# Patient Record
Sex: Female | Born: 1980 | Race: White | Hispanic: No | Marital: Married | Smoking: Current every day smoker
Health system: Southern US, Community
[De-identification: ages and names within clinical notes are randomized; demographics above are authoritative.]

## PROBLEM LIST (undated history)

## (undated) DIAGNOSIS — G43909 Migraine, unspecified, not intractable, without status migrainosus: Secondary | ICD-10-CM

## (undated) DIAGNOSIS — Z87898 Personal history of other specified conditions: Secondary | ICD-10-CM

## (undated) DIAGNOSIS — Z973 Presence of spectacles and contact lenses: Secondary | ICD-10-CM

## (undated) DIAGNOSIS — I10 Essential (primary) hypertension: Secondary | ICD-10-CM

## (undated) DIAGNOSIS — Z8669 Personal history of other diseases of the nervous system and sense organs: Secondary | ICD-10-CM

## (undated) HISTORY — PX: TONSILLECTOMY: SUR1361

## (undated) HISTORY — PX: CARDIOVASCULAR STRESS TEST: SHX262

---

## 2001-11-05 ENCOUNTER — Other Ambulatory Visit: Admission: RE | Admit: 2001-11-05 | Discharge: 2001-11-05 | Payer: Self-pay | Admitting: Obstetrics and Gynecology

## 2001-12-23 ENCOUNTER — Encounter: Payer: Self-pay | Admitting: Obstetrics and Gynecology

## 2001-12-23 ENCOUNTER — Ambulatory Visit (HOSPITAL_COMMUNITY): Admission: RE | Admit: 2001-12-23 | Discharge: 2001-12-23 | Payer: Self-pay | Admitting: Obstetrics and Gynecology

## 2002-01-08 ENCOUNTER — Encounter: Payer: Self-pay | Admitting: Obstetrics and Gynecology

## 2002-01-08 ENCOUNTER — Ambulatory Visit (HOSPITAL_COMMUNITY): Admission: RE | Admit: 2002-01-08 | Discharge: 2002-01-08 | Payer: Self-pay | Admitting: Obstetrics and Gynecology

## 2002-03-21 ENCOUNTER — Encounter: Payer: Self-pay | Admitting: Obstetrics and Gynecology

## 2002-03-21 ENCOUNTER — Inpatient Hospital Stay (HOSPITAL_COMMUNITY): Admission: AD | Admit: 2002-03-21 | Discharge: 2002-03-28 | Payer: Self-pay | Admitting: Gynecology

## 2002-03-23 ENCOUNTER — Encounter: Payer: Self-pay | Admitting: Obstetrics and Gynecology

## 2002-03-29 ENCOUNTER — Encounter: Admission: RE | Admit: 2002-03-29 | Discharge: 2002-04-28 | Payer: Self-pay | Admitting: Obstetrics and Gynecology

## 2002-04-29 ENCOUNTER — Encounter: Admission: RE | Admit: 2002-04-29 | Discharge: 2002-05-29 | Payer: Self-pay | Admitting: Obstetrics and Gynecology

## 2010-09-25 HISTORY — PX: TUMOR REMOVAL: SHX12

## 2011-11-21 ENCOUNTER — Emergency Department (HOSPITAL_COMMUNITY)
Admission: EM | Admit: 2011-11-21 | Discharge: 2011-11-21 | Disposition: A | Discharge status: Home Health | Payer: BC Managed Care – PPO | Attending: Emergency Medicine | Admitting: Emergency Medicine

## 2011-11-21 ENCOUNTER — Encounter (HOSPITAL_COMMUNITY): Payer: Self-pay | Admitting: *Deleted

## 2011-11-21 ENCOUNTER — Emergency Department (HOSPITAL_COMMUNITY): Payer: BC Managed Care – PPO

## 2011-11-21 DIAGNOSIS — F17200 Nicotine dependence, unspecified, uncomplicated: Secondary | ICD-10-CM | POA: Insufficient documentation

## 2011-11-21 DIAGNOSIS — R51 Headache: Secondary | ICD-10-CM | POA: Insufficient documentation

## 2011-11-21 HISTORY — DX: Migraine, unspecified, not intractable, without status migrainosus: G43.909

## 2011-11-21 MED ORDER — MAGNESIUM SULFATE 40 MG/ML IJ SOLN
2.0000 g | Freq: Once | INTRAMUSCULAR | Status: AC
  Administered 2011-11-21: 2 g via INTRAVENOUS
  Filled 2011-11-21: qty 50

## 2011-11-21 MED ORDER — METOCLOPRAMIDE HCL 5 MG/ML IJ SOLN
10.0000 mg | Freq: Once | INTRAMUSCULAR | Status: AC
  Administered 2011-11-21: 10 mg via INTRAVENOUS
  Filled 2011-11-21: qty 2

## 2011-11-21 MED ORDER — DIPHENHYDRAMINE HCL 50 MG/ML IJ SOLN
25.0000 mg | Freq: Once | INTRAMUSCULAR | Status: AC
  Administered 2011-11-21: 25 mg via INTRAVENOUS
  Filled 2011-11-21: qty 1

## 2011-11-21 NOTE — ED Notes (Signed)
Coke and cup of ice provided to pt per her request.  IV Magnesium infusion completed.

## 2011-11-21 NOTE — ED Notes (Signed)
To CDU 1 via stretcher.

## 2011-11-21 NOTE — ED Notes (Signed)
Report was given to [employer]

## 2011-11-21 NOTE — ED Notes (Addendum)
Assumed patient's care. [name], skin warm and dry, respiration even and unlabored. Seen and examined by [name]

## 2011-11-21 NOTE — ED Provider Notes (Signed)
History     CSN: [id number]  Arrival date & time 11/21/11  4098   First MD Initiated Contact with Patient 11/21/11 [phone]      Chief Complaint  Patient presents with  . Headache    (Consider location/radiation/quality/duration/timing/severity/associated sxs/prior treatment) HPI Comments: Patient with a history of migraines presents with onset of headache that is different than her previous. Patient has a several year history of right-sided migraines that are resolved with Imitrex. Patient however now has left neck and left lateral head pain that feels different. She has tried Imitrex and Relpax which have not helped. She was seen at [facility] emergency department twice and was last treated with Zofran, Toradol, and steroids it did not help. She was prescribed Vicodin and then Percocet by her primary care physician that did not help. She was given soma which did not help as well. Patient has photophobia and phonophobia. She has nausea but no vomiting. The pain is constant but waxes and wanes. She denies trouble walking, blurry vision. Patient has an upcoming neurology appointment in April.  Patient is a 31 y.o. female presenting with migraine. The history is provided by the patient.  Migraine This is a new problem. The current episode started 1 to 4 weeks ago. The problem occurs constantly. The problem has been waxing and waning. Associated symptoms include headaches, nausea and neck pain. Pertinent negatives include no abdominal pain, anorexia, chest pain, congestion, coughing, fever, myalgias, numbness, rash, sore throat, visual change, vomiting or weakness. Exacerbated by: light and sound. She has tried nothing for the symptoms.    Past Medical History  Diagnosis Date  . Migraines     Past Surgical History  Procedure Date  . Tumor removal     R leg  . Cesarean section   . Tonsillectomy     Family History  Problem Relation Age of Onset  . Diabetes Mother   . Heart failure  Father   . Cancer Other   . Heart failure Other   . Diabetes Other     History  Substance Use Topics  . Smoking status: Current Everyday Smoker -- 1.0 packs/day  . Smokeless tobacco: Not on file  . Alcohol Use: Yes     occaisionally    OB History    Grav Para Term Preterm Abortions TAB SAB Ect Mult Living                  Review of Systems  Constitutional: Negative for fever.  HENT: Positive for neck pain. Negative for congestion, sore throat, rhinorrhea and neck stiffness.   Eyes: Negative for redness.  Respiratory: Negative for cough.   Cardiovascular: Negative for chest pain.  Gastrointestinal: Positive for nausea. Negative for vomiting, abdominal pain, diarrhea and anorexia.  Genitourinary: Negative for dysuria.  Musculoskeletal: Negative for myalgias.  Skin: Negative for rash.  Neurological: Positive for headaches. Negative for seizures, syncope, speech difficulty, weakness and numbness.  Psychiatric/Behavioral: Negative for confusion.    Allergies  Review of patient's allergies indicates no known allergies.  Home Medications   Current Outpatient Rx  Name Route Sig Dispense Refill  . ETONOGESTREL-ETHINYL ESTRADIOL 0.12-0.015 MG/24HR VA RING Vaginal Place 1 each vaginally every 28 (twenty-eight) days. Insert vaginally and leave in place for 3 consecutive weeks, then remove for 1 week.    Marland Kitchen PROMETHAZINE HCL 25 MG PO TABS Oral Take 25 mg by mouth every 6 (six) hours as needed.      BP 157/95  Pulse 132  Temp(Src)  98.5 F (36.9 C) (Oral)  Resp 20  SpO2 100%  LMP 10/30/2011  Physical Exam  Nursing note and vitals reviewed. Constitutional: She is oriented to person, place, and time. She appears well-developed and well-nourished.  HENT:  Head: Normocephalic and atraumatic.  Eyes: Conjunctivae are normal. Right eye exhibits no discharge. Left eye exhibits no discharge.  Neck: Normal range of motion. Neck supple.       No meningismus  Cardiovascular: Normal  rate, regular rhythm and normal heart sounds.   Pulmonary/Chest: Effort normal and breath sounds normal.  Abdominal: Soft. There is no tenderness.  Neurological: She is alert and oriented to person, place, and time. She has normal strength. No cranial nerve deficit or sensory deficit. She displays a negative Romberg sign. GCS eye subscore is 4. GCS verbal subscore is 5. GCS motor subscore is 6.  Skin: Skin is warm and dry.  Psychiatric: She has a normal mood and affect.    ED Course  Procedures (including critical care time)  Labs Reviewed - No data to display No results found.   No diagnosis found.  7:26 AM Patient seen and examined. Work-up initiated. Medications ordered.   Vital signs reviewed and are as follows: Filed Vitals:   11/21/11 0624  BP: 157/95  Pulse: 132  Temp: 98.5 F (36.9 C)  Resp: 20   Will CT head given 2 previous ED visits, 2 PCP visits, change in headache pattern, not responding to traditional treatment.   7:40 AM Patient was discussed with Ward Givens, MD  8:59 AM CT reviewed and is normal. Patient informed of results. Migraine cocktail did not provide any relief. Will move to CDU and give 2 g of magnesium. If this does not work we may try Valproate 1 g over an hour.  Patient discussed with Albertine Grates.    MDM  Intractable HA, different than previous migraines. CT performed and is unconcerning. No concern for meningitis, pseudotumor given exam and history. No h/o head injury. Will give treatments in CDU. Patient to follow-up with her neurologist.         Carolee Rota, PA 11/21/11 1001

## 2011-11-21 NOTE — Discharge Instructions (Signed)
Please followup with your doctor at 11:30 as scheduled he now is return to the emergency department for worsening symptoms.   Headaches, Frequently Asked Questions MIGRAINE HEADACHES Q: What is migraine? What causes it? How can I treat it? A: Generally, migraine headaches begin as a dull ache. Then they develop into a constant, throbbing, and pulsating pain. You may experience pain at the temples. You may experience pain at the front or back of one or both sides of the head. The pain is usually accompanied by a combination of:  Nausea.   Vomiting.   Sensitivity to light and noise.  Some people (about 15%) experience an aura (see below) before an attack. The cause of migraine is believed to be chemical reactions in the brain. Treatment for migraine may include over-the-counter or prescription medications. It may also include self-help techniques. These include relaxation training and biofeedback.  Q: What is an aura? A: About 15% of people with migraine get an "aura". This is a sign of neurological symptoms that occur before a migraine headache. You may see wavy or jagged lines, dots, or flashing lights. You might experience tunnel vision or blind spots in one or both eyes. The aura can include visual or auditory hallucinations (something imagined). It may include disruptions in smell (such as strange odors), taste or touch. Other symptoms include:  Numbness.   A "pins and needles" sensation.   Difficulty in recalling or speaking the correct word.  These neurological events may last as long as 60 minutes. These symptoms will fade as the headache begins. Q: What is a trigger? A: Certain physical or environmental factors can lead to or "trigger" a migraine. These include:  Foods.   Hormonal changes.   Weather.   Stress.  It is important to remember that triggers are different for everyone. To help prevent migraine attacks, you need to figure out which triggers affect you. Keep a headache  diary. This is a good way to track triggers. The diary will help you talk to your healthcare professional about your condition. Q: Does weather affect migraines? A: Bright sunshine, hot, humid conditions, and drastic changes in barometric pressure may lead to, or "trigger," a migraine attack in some people. But studies have shown that weather does not act as a trigger for everyone with migraines. Q: What is the link between migraine and hormones? A: Hormones start and regulate many of your body's functions. Hormones keep your body in balance within a constantly changing environment. The levels of hormones in your body are unbalanced at times. Examples are during menstruation, pregnancy, or menopause. That can lead to a migraine attack. In fact, about three quarters of all women with migraine report that their attacks are related to the menstrual cycle.  Q: Is there an increased risk of stroke for migraine sufferers? A: The likelihood of a migraine attack causing a stroke is very remote. That is not to say that migraine sufferers cannot have a stroke associated with their migraines. In persons under age 74, the most common associated factor for stroke is migraine headache. But over the course of a person's normal life span, the occurrence of migraine headache may actually be associated with a reduced risk of dying from cerebrovascular disease due to stroke.  Q: What are acute medications for migraine? A: Acute medications are used to treat the pain of the headache after it has started. Examples over-the-counter medications, NSAIDs, ergots, and triptans.  Q: What are the triptans? A: Triptans are the newest  class of abortive medications. They are specifically targeted to treat migraine. Triptans are vasoconstrictors. They moderate some chemical reactions in the brain. The triptans work on receptors in your brain. Triptans help to restore the balance of a neurotransmitter called serotonin. Fluctuations in  levels of serotonin are thought to be a main cause of migraine.  Q: Are over-the-counter medications for migraine effective? A: Over-the-counter, or "OTC," medications may be effective in relieving mild to moderate pain and associated symptoms of migraine. But you should see your caregiver before beginning any treatment regimen for migraine.  Q: What are preventive medications for migraine? A: Preventive medications for migraine are sometimes referred to as "prophylactic" treatments. They are used to reduce the frequency, severity, and length of migraine attacks. Examples of preventive medications include antiepileptic medications, antidepressants, beta-blockers, calcium channel blockers, and NSAIDs (nonsteroidal anti-inflammatory drugs). Q: Why are anticonvulsants used to treat migraine? A: During the past few years, there has been an increased interest in antiepileptic drugs for the prevention of migraine. They are sometimes referred to as "anticonvulsants". Both epilepsy and migraine may be caused by similar reactions in the brain.  Q: Why are antidepressants used to treat migraine? A: Antidepressants are typically used to treat people with depression. They may reduce migraine frequency by regulating chemical levels, such as serotonin, in the brain.  Q: What alternative therapies are used to treat migraine? A: The term "alternative therapies" is often used to describe treatments considered outside the scope of conventional Western medicine. Examples of alternative therapy include acupuncture, acupressure, and yoga. Another common alternative treatment is herbal therapy. Some herbs are believed to relieve headache pain. Always discuss alternative therapies with your caregiver before proceeding. Some herbal products contain arsenic and other toxins. TENSION HEADACHES Q: What is a tension-type headache? What causes it? How can I treat it? A: Tension-type headaches occur randomly. They are often the  result of temporary stress, anxiety, fatigue, or anger. Symptoms include soreness in your temples, a tightening band-like sensation around your head (a "vice-like" ache). Symptoms can also include a pulling feeling, pressure sensations, and contracting head and neck muscles. The headache begins in your forehead, temples, or the back of your head and neck. Treatment for tension-type headache may include over-the-counter or prescription medications. Treatment may also include self-help techniques such as relaxation training and biofeedback. CLUSTER HEADACHES Q: What is a cluster headache? What causes it? How can I treat it? A: Cluster headache gets its name because the attacks come in groups. The pain arrives with little, if any, warning. It is usually on one side of the head. A tearing or bloodshot eye and a runny nose on the same side of the headache may also accompany the pain. Cluster headaches are believed to be caused by chemical reactions in the brain. They have been described as the most severe and intense of any headache type. Treatment for cluster headache includes prescription medication and oxygen. SINUS HEADACHES Q: What is a sinus headache? What causes it? How can I treat it? A: When a cavity in the bones of the face and skull (a sinus) becomes inflamed, the inflammation will cause localized pain. This condition is usually the result of an allergic reaction, a tumor, or an infection. If your headache is caused by a sinus blockage, such as an infection, you will probably have a fever. An x-ray will confirm a sinus blockage. Your caregiver's treatment might include antibiotics for the infection, as well as antihistamines or decongestants.  REBOUND HEADACHES Q:  What is a rebound headache? What causes it? How can I treat it? A: A pattern of taking acute headache medications too often can lead to a condition known as "rebound headache." A pattern of taking too much headache medication includes taking  it more than 2 days per week or in excessive amounts. That means more than the label or a caregiver advises. With rebound headaches, your medications not only stop relieving pain, they actually begin to cause headaches. Doctors treat rebound headache by tapering the medication that is being overused. Sometimes your caregiver will gradually substitute a different type of treatment or medication. Stopping may be a challenge. Regularly overusing a medication increases the potential for serious side effects. Consult a caregiver if you regularly use headache medications more than 2 days per week or more than the label advises. ADDITIONAL QUESTIONS AND ANSWERS Q: What is biofeedback? A: Biofeedback is a self-help treatment. Biofeedback uses special equipment to monitor your body's involuntary physical responses. Biofeedback monitors:  Breathing.   Pulse.   Heart rate.   Temperature.   Muscle tension.   Brain activity.  Biofeedback helps you refine and perfect your relaxation exercises. You learn to control the physical responses that are related to stress. Once the technique has been mastered, you do not need the equipment any more. Q: Are headaches hereditary? A: Four out of five (80%) of people that suffer report a family history of migraine. Scientists are not sure if this is genetic or a family predisposition. Despite the uncertainty, a child has a 50% chance of having migraine if one parent suffers. The child has a 75% chance if both parents suffer.  Q: Can children get headaches? A: By the time they reach high school, most young people have experienced some type of headache. Many safe and effective approaches or medications can prevent a headache from occurring or stop it after it has begun.  Q: What type of doctor should I see to diagnose and treat my headache? A: Start with your primary caregiver. Discuss his or her experience and approach to headaches. Discuss methods of classification,  diagnosis, and treatment. Your caregiver may decide to recommend you to a headache specialist, depending upon your symptoms or other physical conditions. Having diabetes, allergies, etc., may require a more comprehensive and inclusive approach to your headache. The National Headache Foundation will provide, upon request, a list of [facility] physician members in your state. Document Released: 12/02/2003 Document Revised: 05/24/2011 Document Reviewed: 05/11/2008 [facility] Patient Information 2012 [place], [place].

## 2011-11-21 NOTE — ED Provider Notes (Signed)
Medical screening examination/treatment/procedure(s) were performed by non-physician practitioner and as supervising physician I was immediately available for consultation/collaboration. Devoria Albe, MD, FACEP   Ward Givens, MD 11/21/11 [phone]

## 2011-11-21 NOTE — ED Notes (Signed)
Bridgette, PA at pt bedside at this time.  Pt states her neurologist can see her in his office today at 1130.

## 2011-11-21 NOTE — ED Notes (Signed)
J. Geiple PAC at bedside. 

## 2011-11-21 NOTE — ED Notes (Addendum)
dx'd with migraines, c/o HA, onset 2/5, ongoing since then, was in ED in [facility] Sunday & yesterday, also seen multiple visits at [facility]. No relief with multiple meds & txs given.  Also reports nausea. Wearing sunglasses. Pinpoints pain to "behind L eye and behind L ear", "different from usual migraines".

## 2011-11-21 NOTE — ED Notes (Signed)
Patient here for migraine headache onset feb 5th, has taken her home meds taht are prescribed and no relief, pt sts that hx of same.

## 2011-11-21 NOTE — ED Notes (Signed)
Ambulated to restroom. With PA resident.

## 2011-11-21 NOTE — ED Notes (Signed)
Pt claimed that her headache is not better. Renne Crigler [facility] made aware.

## 2011-11-21 NOTE — ED Provider Notes (Signed)
10:13 AM Patient was placed in CDU for headache control. Currently receiving magnesium IV for headache. Patient states she has spoken with her neurologist, Dr Anne Hahn. States he is able to see her today at 11:30 and she requests to be discharged. No acute findings on CT of the head. Likely progression of chronic migraines.  Thomasene Lot, PA-C 11/21/11 1016

## 2011-11-21 NOTE — ED Provider Notes (Signed)
Medical screening examination/treatment/procedure(s) were performed by non-physician practitioner and as supervising physician I was immediately available for consultation/collaboration. Devoria Albe, MD, Armando Gang   Ward Givens, MD 11/21/11 [phone]

## 2013-06-03 IMAGING — CT CT HEAD W/O CM
1 of 2 series · 13 of 30 positions shown, 17 images · non-contrast
Comparison: None.

CLINICAL DATA: Headache, history of migraines, pain behind the left
eye

CT HEAD WITHOUT CONTRAST
TECHNIQUE: Contiguous axial images were obtained from the base of
the skull through the vertex without contrast.

[Series 2: brain · axial · 0.47mm/px · z∈[+159,+280]mm · 13 of 28 slices shown, 17 images]
[im 2/28  brain]
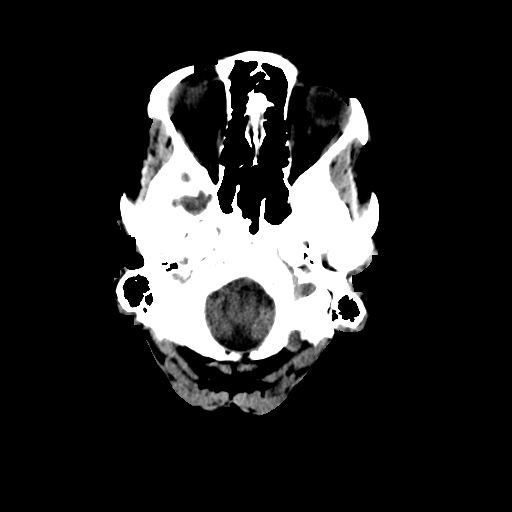
[im 2/28  bone]
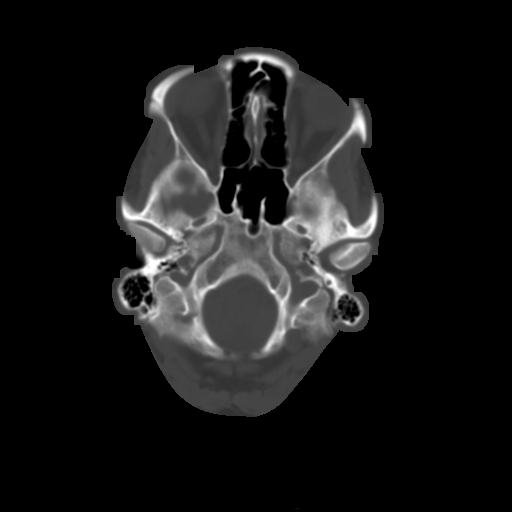
[im 4/28  brain]
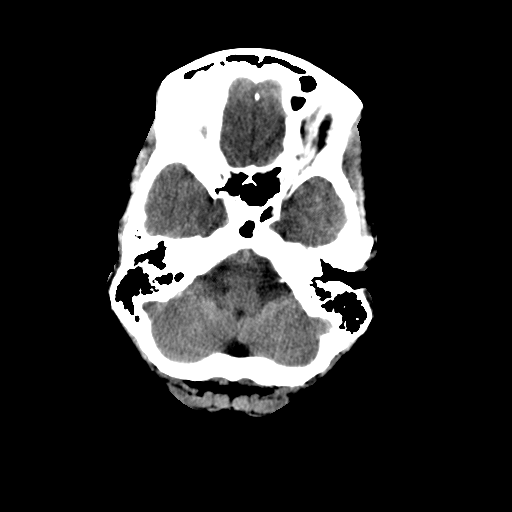
[im 6/28  brain]
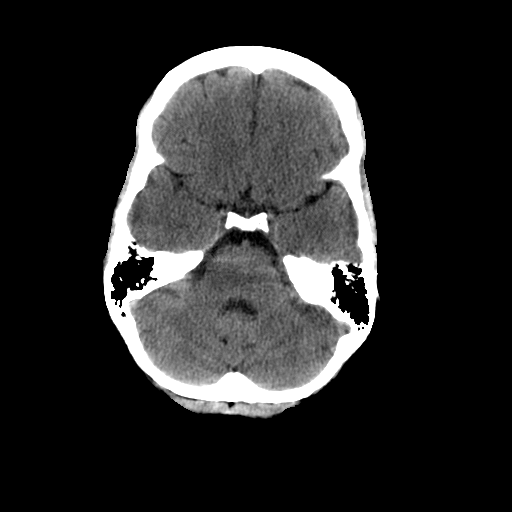
[im 8/28  brain]
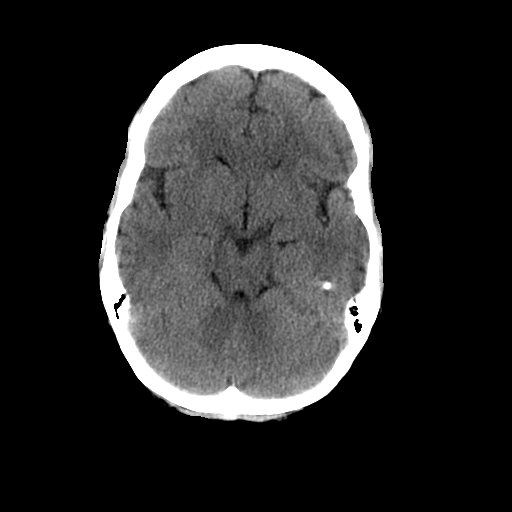
[im 10/28  brain]
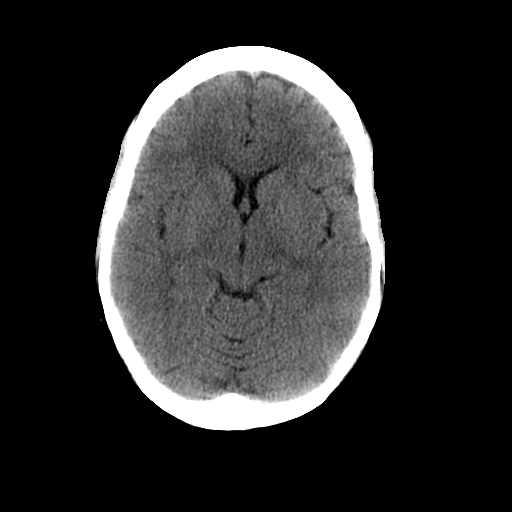
[im 10/28  bone]
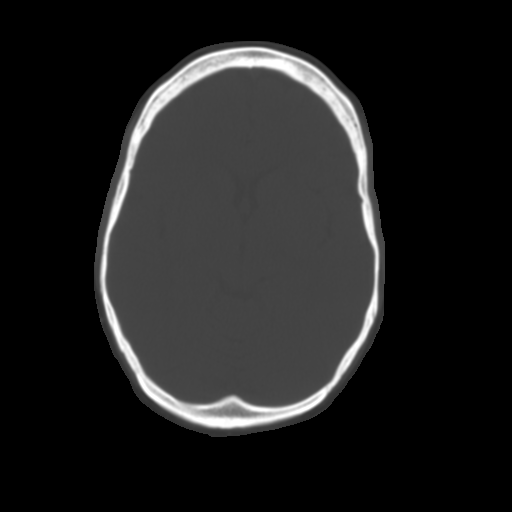
[im 12/28  brain]
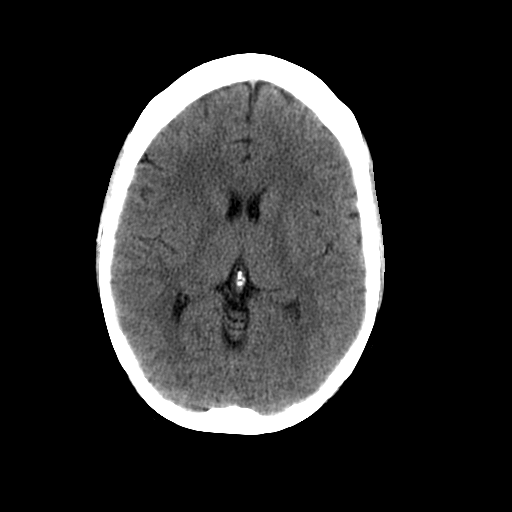
[im 14/28  brain]
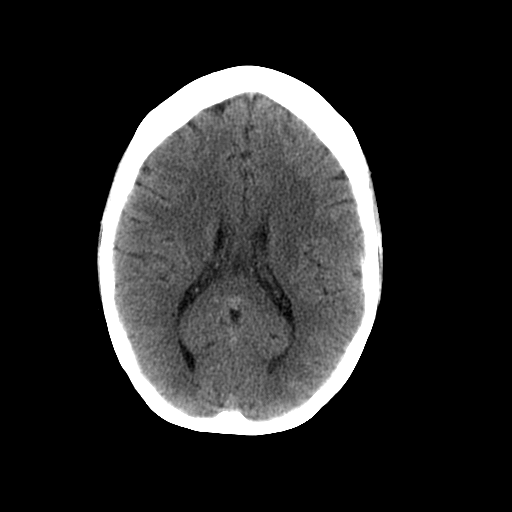
[im 16/28  brain]
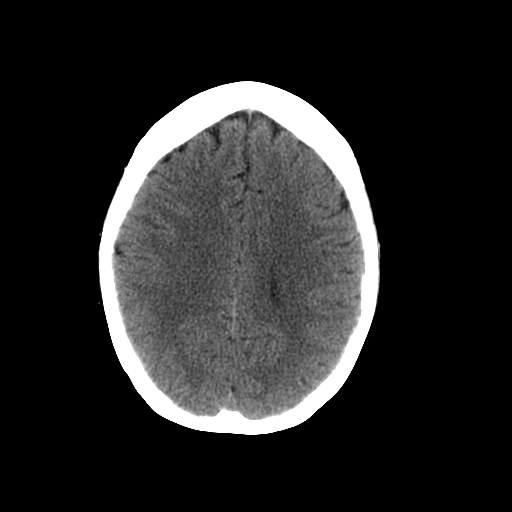
[im 18/28  brain]
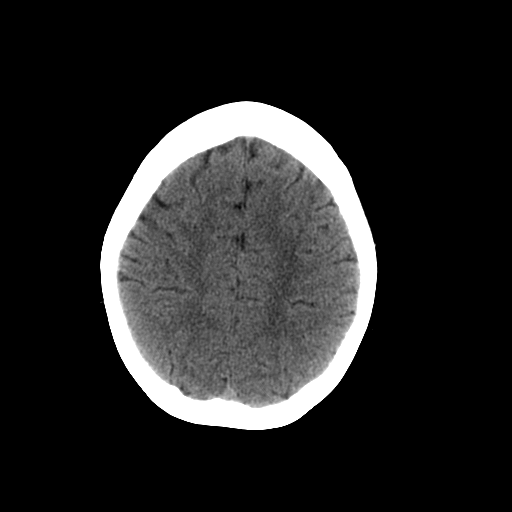
[im 18/28  bone]
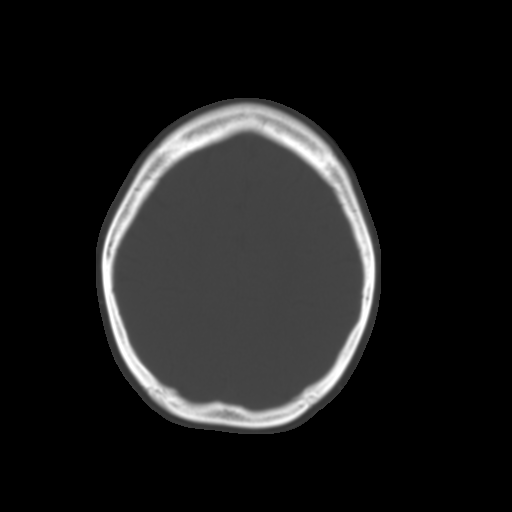
[im 20/28  brain]
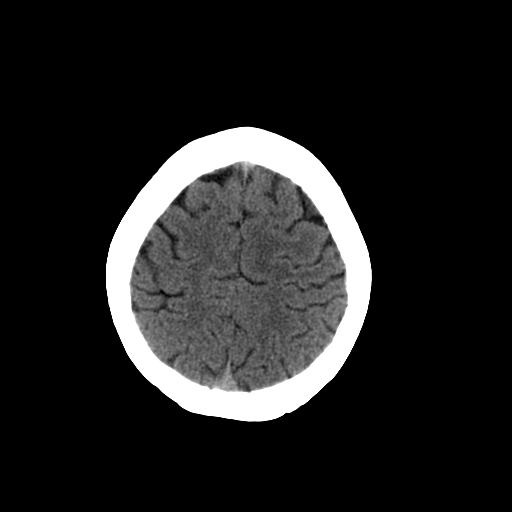
[im 22/28  brain]
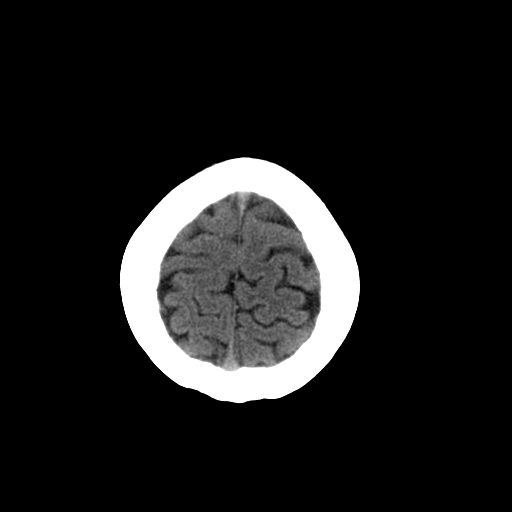
[im 24/28  brain]
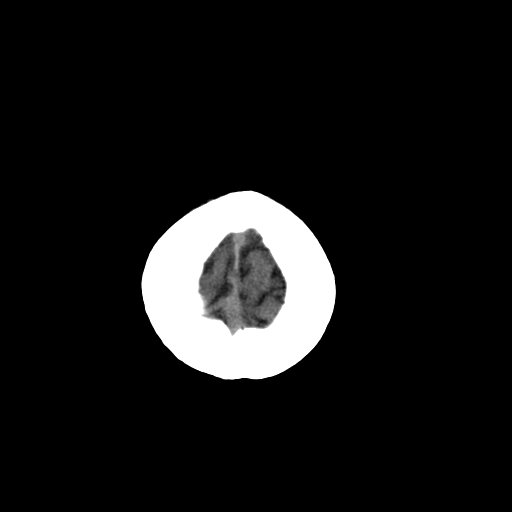
[im 26/28  brain]
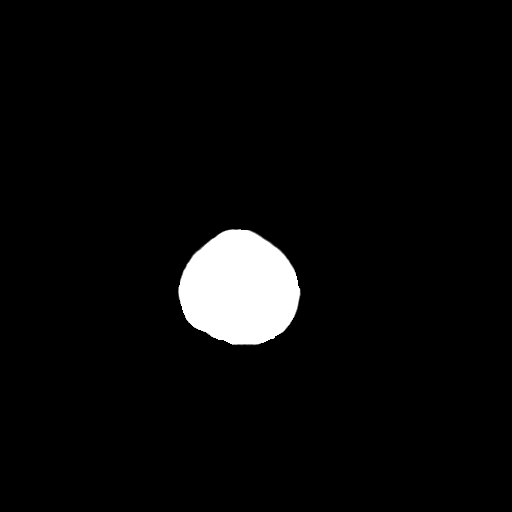
[im 26/28  bone]
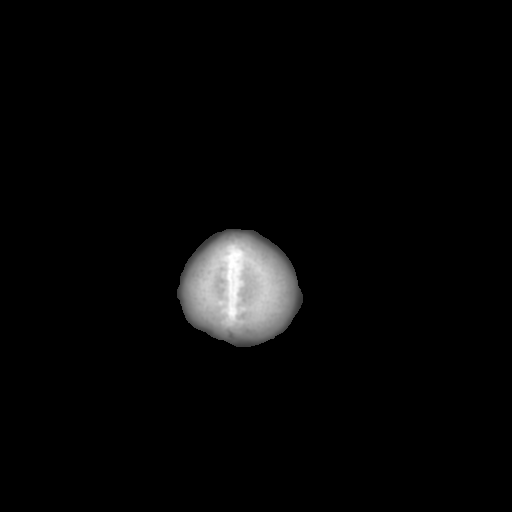

[13 of 30 positions shown; findings below may reference images not displayed]

FINDINGS: The ventricular system is normal in size and
configuration and the septum is midline in position.  The fourth
ventricle and basilar cisterns appear normal.  No hemorrhage, mass
lesion, or acute infarction is seen.  On bone window images, no
calvarial abnormality is seen.  The paranasal sinuses are clear.
IMPRESSION: Negative unenhanced CT of the brain.

## 2015-05-21 ENCOUNTER — Other Ambulatory Visit (HOSPITAL_COMMUNITY): Payer: Self-pay | Admitting: Obstetrics and Gynecology

## 2015-05-21 DIAGNOSIS — R1012 Left upper quadrant pain: Secondary | ICD-10-CM

## 2015-05-26 ENCOUNTER — Ambulatory Visit (HOSPITAL_COMMUNITY)
Admission: RE | Admit: 2015-05-26 | Discharge: 2015-05-26 | Disposition: A | Discharge status: Home / Self Care | Payer: PRIVATE HEALTH INSURANCE | Source: Ambulatory Visit | Attending: Obstetrics and Gynecology | Admitting: Obstetrics and Gynecology

## 2015-05-26 DIAGNOSIS — R1012 Left upper quadrant pain: Secondary | ICD-10-CM | POA: Diagnosis not present

## 2015-05-26 DIAGNOSIS — F17200 Nicotine dependence, unspecified, uncomplicated: Secondary | ICD-10-CM | POA: Diagnosis not present

## 2017-08-12 IMAGING — US US ABDOMEN COMPLETE
1 series · 14 of 25 positions shown · non-contrast
Comparison: None.

CLINICAL DATA: LEFT upper quadrant abdominal pain for 6 months
intermittently, smoker

EXAM:
ULTRASOUND ABDOMEN COMPLETE

[Series 1: us abdomen complete · 0.20mm/px · 14 of 33 slices shown]
[im 1/33]
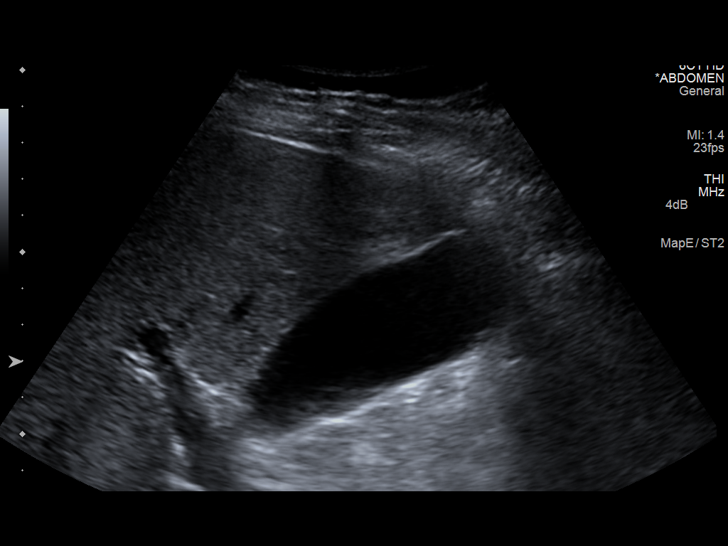
[im 3/33]
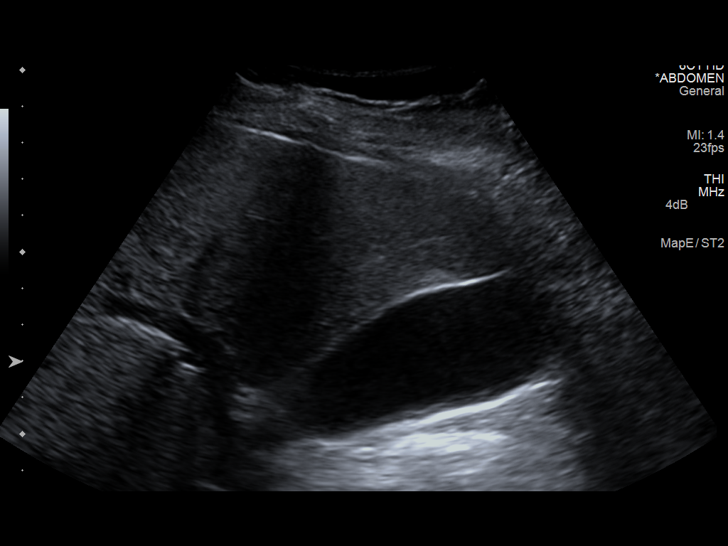
[im 6/33]
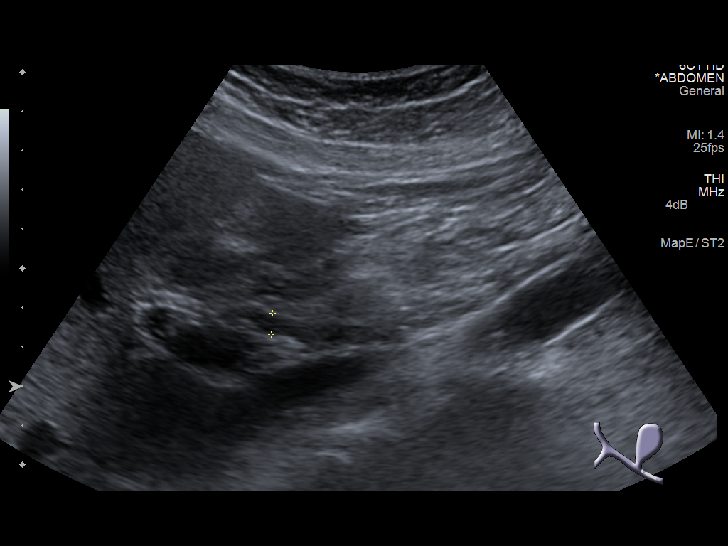
[im 9/33]
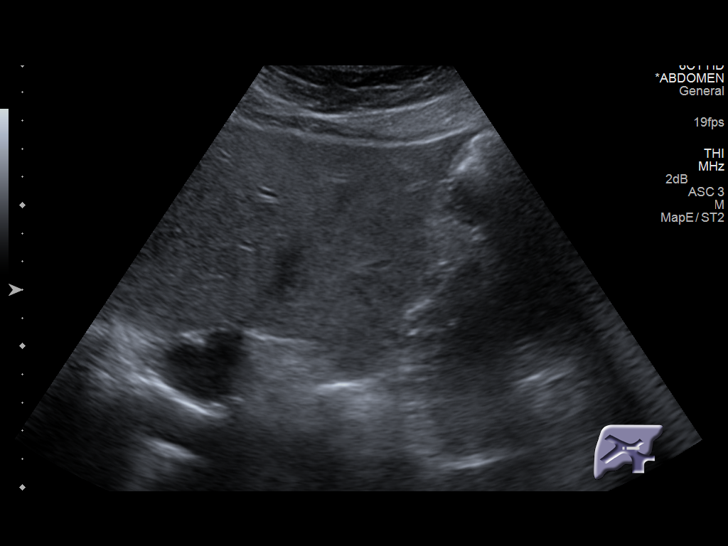
[im 11/33]
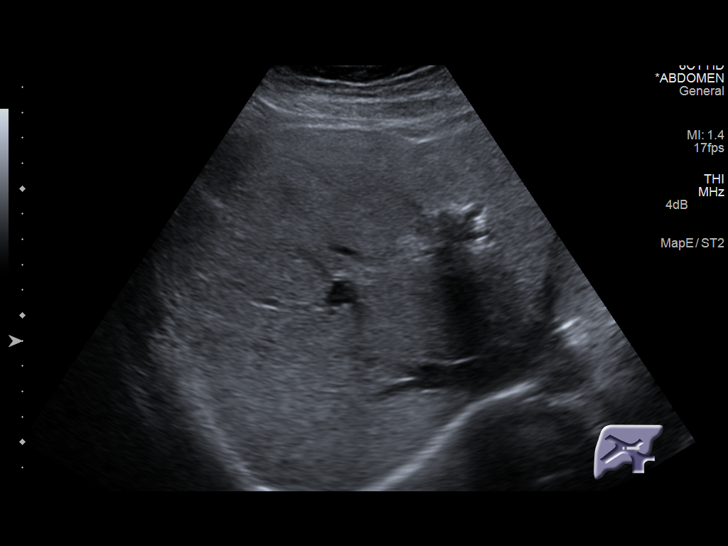
[im 13/33]
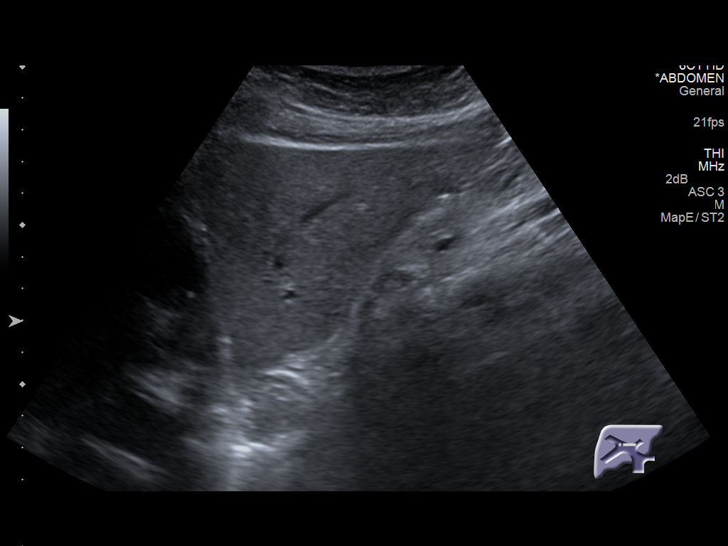
[im 15/33]
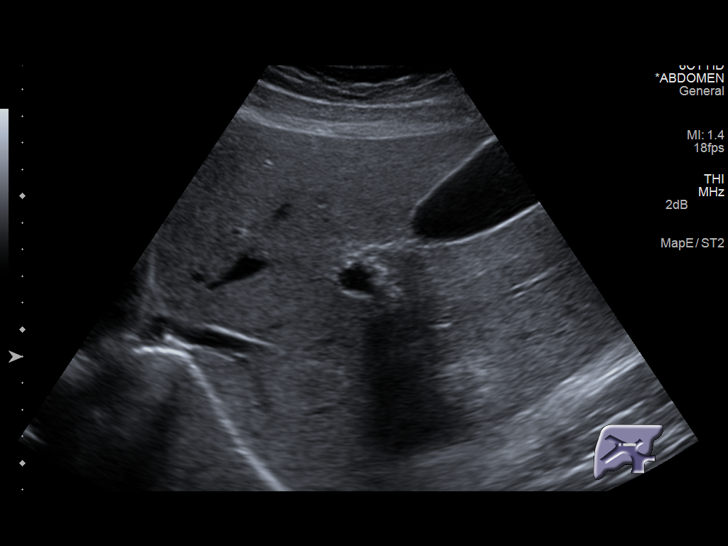
[im 18/33]
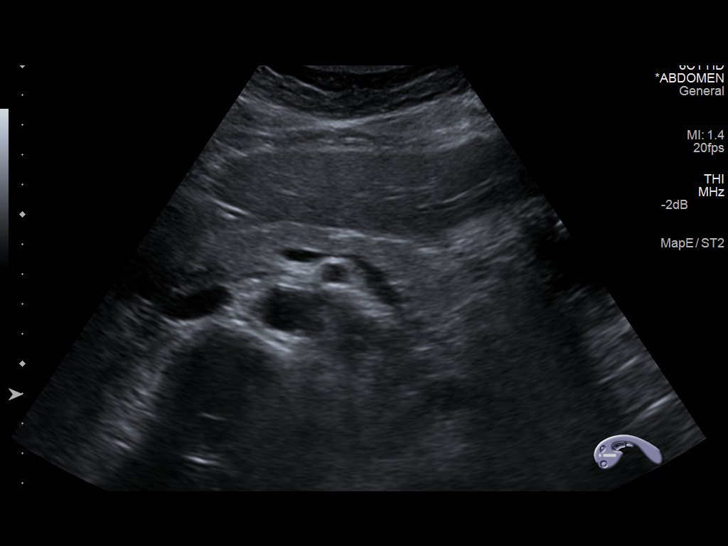
[im 21/33]
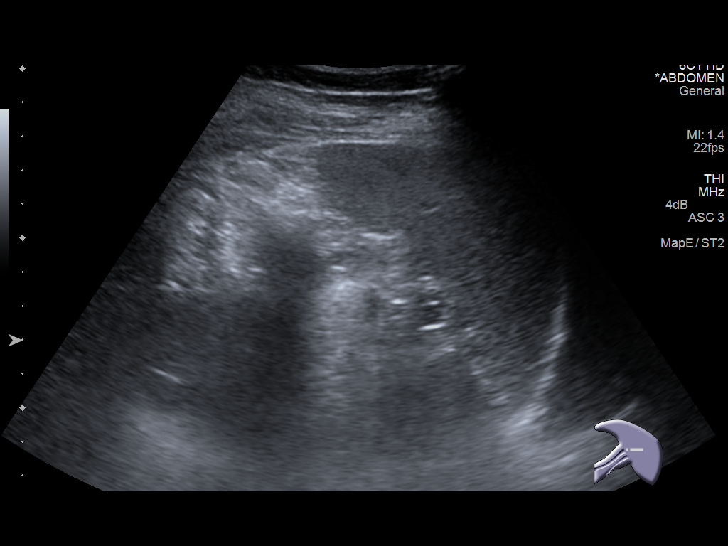
[im 22/33]
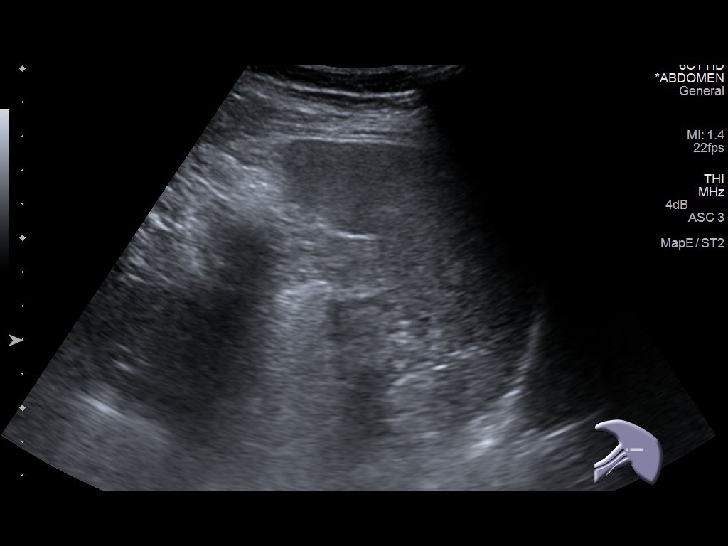
[im 25/33]
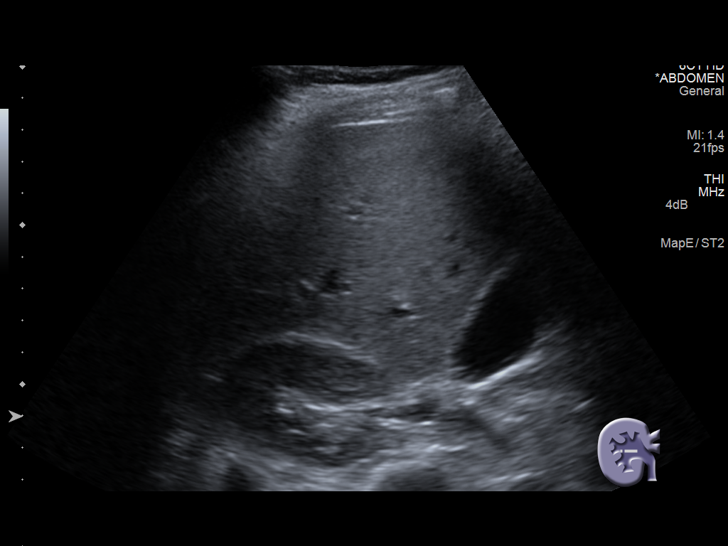
[im 27/33]
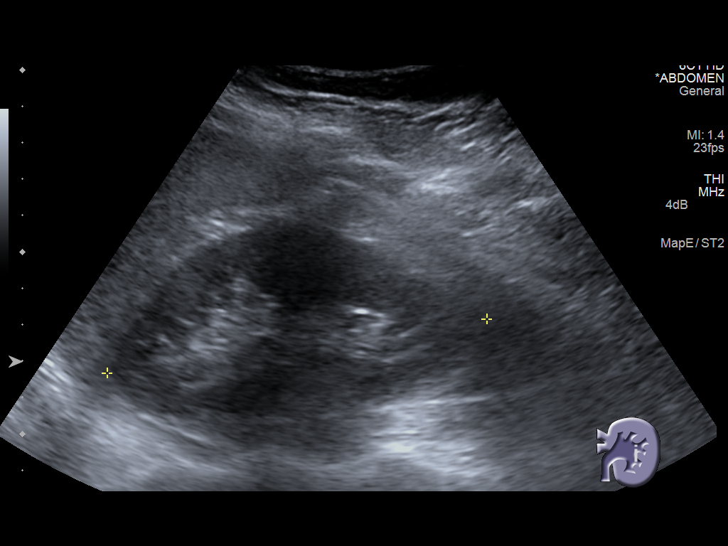
[im 30/33]
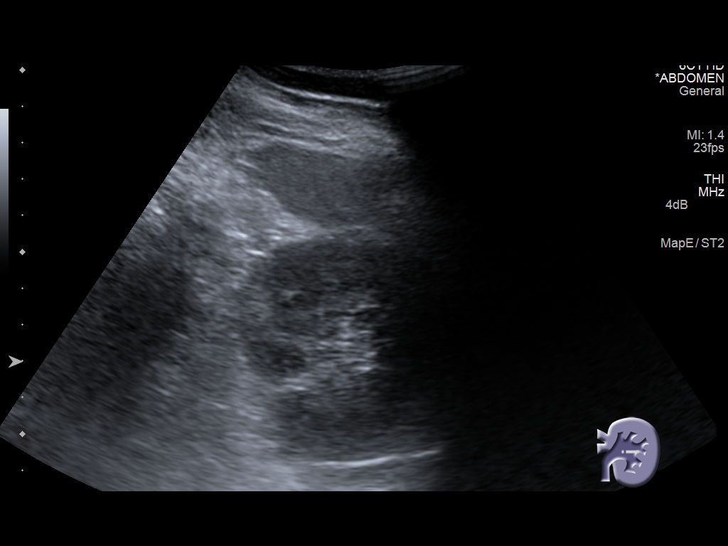
[im 33/33]
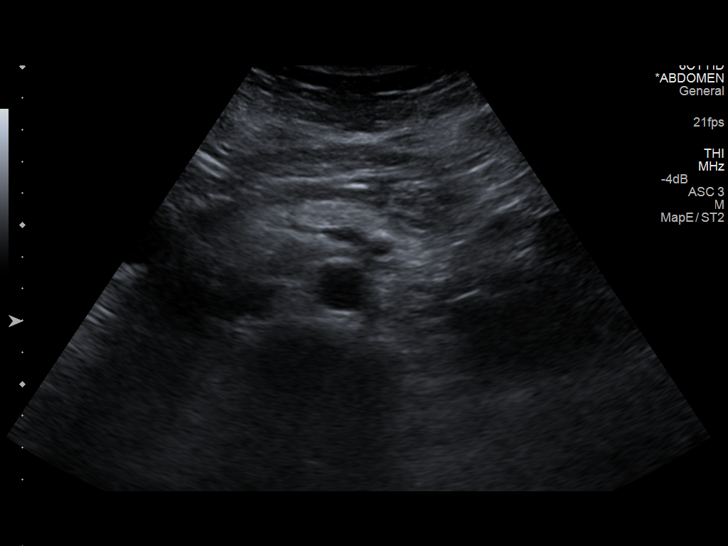

[14 of 25 positions shown; findings below may reference images not displayed]

FINDINGS: Gallbladder: Normally distended without stones or wall thickening.
No pericholecystic fluid or sonographic Murphy sign.

Common bile duct: Diameter: 5 mm diameter , normal

Liver: Echogenic, likely fatty infiltration, though this can be seen
with cirrhosis and certain infiltrative disorders. No focal hepatic
mass or nodularity. Hepatopetal portal venous flow.

IVC: Normal appearance

Pancreas: Normal appearance

Spleen: Normal appearance, 5.6 cm length

Right Kidney: Length: 9.8 cm. Normal morphology without mass or
hydronephrosis.

Left Kidney: Length: 10.6 cm. Normal morphology without mass or
hydronephrosis.

Abdominal aorta: Normal caliber

Other findings: No free fluid
IMPRESSION: Probable mild fatty infiltration liver as above.

Otherwise normal exam.

## 2018-05-08 ENCOUNTER — Encounter (HOSPITAL_BASED_OUTPATIENT_CLINIC_OR_DEPARTMENT_OTHER): Payer: Self-pay | Admitting: *Deleted

## 2018-05-10 ENCOUNTER — Encounter (HOSPITAL_BASED_OUTPATIENT_CLINIC_OR_DEPARTMENT_OTHER): Payer: Self-pay | Admitting: *Deleted

## 2018-05-10 NOTE — Progress Notes (Addendum)
Spoke w/ pt via phone for pre-op interview.  Npo after mn.  Arrive at [id number].  Needs urine preg.  Getting cbc, bmet, and ekg done Tuesday 05-14-2018  @ 1300.

## 2018-05-14 ENCOUNTER — Encounter (HOSPITAL_COMMUNITY)
Admission: RE | Admit: 2018-05-14 | Discharge: 2018-05-14 | Disposition: A | Discharge status: Home / Self Care | Payer: 59 | Source: Ambulatory Visit | Attending: Obstetrics and Gynecology | Admitting: Obstetrics and Gynecology

## 2018-05-14 ENCOUNTER — Other Ambulatory Visit: Payer: Self-pay

## 2018-05-14 DIAGNOSIS — F17210 Nicotine dependence, cigarettes, uncomplicated: Secondary | ICD-10-CM | POA: Diagnosis not present

## 2018-05-14 DIAGNOSIS — Z302 Encounter for sterilization: Secondary | ICD-10-CM | POA: Diagnosis present

## 2018-05-14 DIAGNOSIS — I10 Essential (primary) hypertension: Secondary | ICD-10-CM | POA: Diagnosis not present

## 2018-05-14 LAB — CBC
HCT: 41.3 % (ref 36.0–46.0)
Hemoglobin: 14.4 g/dL (ref 12.0–15.0)
MCH: 34.6 pg — AB (ref 26.0–34.0)
MCHC: 34.9 g/dL (ref 30.0–36.0)
MCV: 99.3 fL (ref 78.0–100.0)
PLATELETS: 160 10*3/uL (ref 150–400)
RBC: 4.16 MIL/uL (ref 3.87–5.11)
RDW: 12.2 % (ref 11.5–15.5)
WBC: 6.3 10*3/uL (ref 4.0–10.5)

## 2018-05-14 LAB — BASIC METABOLIC PANEL
Anion gap: 10 (ref 5–15)
BUN: 7 mg/dL (ref 6–20)
CALCIUM: 9.3 mg/dL (ref 8.9–10.3)
CO2: 24 mmol/L (ref 22–32)
CREATININE: 0.76 mg/dL (ref 0.44–1.00)
Chloride: 107 mmol/L (ref 98–111)
GFR calc Af Amer: >60 mL/min (ref >60)
GLUCOSE: 164 mg/dL — AB (ref 70–99)
Potassium: 3.8 mmol/L (ref 3.5–5.1)
Sodium: 141 mmol/L (ref 135–145)

## 2018-05-16 NOTE — H&P (Signed)
Brittney [name] is an 37 y.o. female. She desires permanent sterility so is being admitted for laparoscopic BTL.  Pertinent Gynecological History: Last pap: normal Date: 01/2017 OB History: G1, P0101   Menstrual History: Patient's last menstrual period was 05/02/2018 (exact date).    Past Medical History:  Diagnosis Date  . History of migraine   . History of palpitations   . Hypertension    05-10-2018  per pt has been dx w/ htn but has taken any medication since 2015,  per pt last bp taken was 140/ 90  . Migraines   . Wears glasses     Past Surgical History:  Procedure Laterality Date  . CARDIOVASCULAR STRESS TEST  12-19-2013   @[facility]   normal nuclear perfusion w/ no ischemia/  normal LV function and wall motion ,  ef 68%  . CESAREAN SECTION  2003  . TONSILLECTOMY  age 89  . TUMOR REMOVAL  2012   Right  leg (benign)    Family History  Problem Relation Age of Onset  . Heart failure Father   . Diabetes Mother   . Cancer Other   . Heart failure Other   . Diabetes Other     Social History:  reports that she has been smoking cigarettes. She has a 22.00 pack-year smoking history. She has never used smokeless tobacco. She reports that she drinks about 14.0 standard drinks of alcohol per week. She reports that she does not use drugs.  Allergies: No Known Allergies  No medications prior to admission.    Review of Systems  Respiratory: Negative.   Cardiovascular: Negative.     Height 5\' 7"  (1.702 m), weight 65.8 kg, last menstrual period 05/02/2018. Physical Exam  Constitutional: She appears well-developed and well-nourished.  Neck: Neck supple. No thyromegaly present.  Cardiovascular: Normal rate, regular rhythm and normal heart sounds.  No murmur heard. Respiratory: Effort normal and breath sounds normal. No respiratory distress. She has no wheezes.  GI: Soft. She exhibits no distension and no mass. There is no tenderness.  Genitourinary:  Vagina normal and uterus normal.    No results found for this or any previous visit (from the past 24 hour(s)).  No results found.  Assessment/Plan: Desires permanent sterility.  Discussed all options for contraception, surgical procedure, risks, failure rate and permanency.  Will admit for laparoscopic bilateral salpingectomy.  Leighton Roachodd D Delphine Sizemore 05/16/2018, 1:04 PM

## 2018-05-17 ENCOUNTER — Ambulatory Visit (HOSPITAL_BASED_OUTPATIENT_CLINIC_OR_DEPARTMENT_OTHER)
Admission: RE | Admit: 2018-05-17 | Discharge: 2018-05-17 | Disposition: A | Discharge status: Home / Self Care | Payer: 59 | Source: Ambulatory Visit | Attending: Obstetrics and Gynecology | Admitting: Obstetrics and Gynecology

## 2018-05-17 ENCOUNTER — Encounter (HOSPITAL_BASED_OUTPATIENT_CLINIC_OR_DEPARTMENT_OTHER): Payer: Self-pay | Admitting: *Deleted

## 2018-05-17 ENCOUNTER — Encounter (HOSPITAL_BASED_OUTPATIENT_CLINIC_OR_DEPARTMENT_OTHER)
Admission: RE | Disposition: A | Discharge status: Home / Self Care | Payer: Self-pay | Source: Ambulatory Visit | Attending: Obstetrics and Gynecology

## 2018-05-17 ENCOUNTER — Ambulatory Visit (HOSPITAL_BASED_OUTPATIENT_CLINIC_OR_DEPARTMENT_OTHER): Payer: 59 | Admitting: Anesthesiology

## 2018-05-17 ENCOUNTER — Other Ambulatory Visit: Payer: Self-pay

## 2018-05-17 DIAGNOSIS — Z302 Encounter for sterilization: Secondary | ICD-10-CM

## 2018-05-17 DIAGNOSIS — F17210 Nicotine dependence, cigarettes, uncomplicated: Secondary | ICD-10-CM | POA: Insufficient documentation

## 2018-05-17 DIAGNOSIS — I10 Essential (primary) hypertension: Secondary | ICD-10-CM | POA: Insufficient documentation

## 2018-05-17 HISTORY — PX: LAPAROSCOPIC BILATERAL SALPINGECTOMY: SHX5889

## 2018-05-17 HISTORY — DX: Presence of spectacles and contact lenses: Z97.3

## 2018-05-17 HISTORY — DX: Personal history of other diseases of the nervous system and sense organs: Z86.69

## 2018-05-17 HISTORY — DX: Essential (primary) hypertension: I10

## 2018-05-17 HISTORY — DX: Personal history of other specified conditions: Z87.898

## 2018-05-17 LAB — POCT PREGNANCY, URINE: Preg Test, Ur: NEGATIVE

## 2018-05-17 SURGERY — SALPINGECTOMY, BILATERAL, LAPAROSCOPIC
Anesthesia: General | Laterality: Bilateral

## 2018-05-17 MED ORDER — 0.9 % SODIUM CHLORIDE (POUR BTL) OPTIME
TOPICAL | Status: DC | PRN
  Administered 2018-05-17: 1000 mL

## 2018-05-17 MED ORDER — FENTANYL CITRATE (PF) 100 MCG/2ML IJ SOLN
INTRAMUSCULAR | Status: AC
  Filled 2018-05-17: qty 2

## 2018-05-17 MED ORDER — MIDAZOLAM HCL 2 MG/2ML IJ SOLN
INTRAMUSCULAR | Status: AC
  Filled 2018-05-17: qty 2

## 2018-05-17 MED ORDER — PROPOFOL 10 MG/ML IV BOLUS
INTRAVENOUS | Status: AC
  Filled 2018-05-17: qty 20

## 2018-05-17 MED ORDER — SCOPOLAMINE 1 MG/3DAYS TD PT72
MEDICATED_PATCH | TRANSDERMAL | Status: AC
  Filled 2018-05-17: qty 1

## 2018-05-17 MED ORDER — PROPOFOL 10 MG/ML IV BOLUS
INTRAVENOUS | Status: DC | PRN
  Administered 2018-05-17: 150 mg via INTRAVENOUS

## 2018-05-17 MED ORDER — CEFAZOLIN SODIUM-DEXTROSE 2-4 GM/100ML-% IV SOLN
INTRAVENOUS | Status: AC
  Filled 2018-05-17: qty 100

## 2018-05-17 MED ORDER — KETOROLAC TROMETHAMINE 30 MG/ML IJ SOLN
INTRAMUSCULAR | Status: DC | PRN
  Administered 2018-05-17: 30 mg via INTRAVENOUS

## 2018-05-17 MED ORDER — LACTATED RINGERS IV SOLN
INTRAVENOUS | Status: DC
  Filled 2018-05-17: qty 1000

## 2018-05-17 MED ORDER — BUPIVACAINE HCL (PF) 0.25 % IJ SOLN
INTRAMUSCULAR | Status: DC | PRN
  Administered 2018-05-17: 10 mL

## 2018-05-17 MED ORDER — SUGAMMADEX SODIUM 200 MG/2ML IV SOLN
INTRAVENOUS | Status: AC
  Filled 2018-05-17: qty 2

## 2018-05-17 MED ORDER — OXYCODONE HCL 5 MG PO TABS
ORAL_TABLET | ORAL | Status: AC
  Filled 2018-05-17: qty 1

## 2018-05-17 MED ORDER — ESMOLOL HCL 100 MG/10ML IV SOLN
INTRAVENOUS | Status: DC | PRN
  Administered 2018-05-17: 20 mg via INTRAVENOUS

## 2018-05-17 MED ORDER — MIDAZOLAM HCL 5 MG/5ML IJ SOLN
INTRAMUSCULAR | Status: DC | PRN
  Administered 2018-05-17: 2 mg via INTRAVENOUS

## 2018-05-17 MED ORDER — HYDROCODONE-ACETAMINOPHEN 5-325 MG PO TABS: 1.0000 | ORAL_TABLET | Freq: Four times a day (QID) | ORAL | 0 refills | Status: AC | PRN

## 2018-05-17 MED ORDER — DEXAMETHASONE SODIUM PHOSPHATE 10 MG/ML IJ SOLN
INTRAMUSCULAR | Status: DC | PRN
  Administered 2018-05-17: 10 mg via INTRAVENOUS

## 2018-05-17 MED ORDER — ONDANSETRON HCL 4 MG/2ML IJ SOLN
INTRAMUSCULAR | Status: AC
  Filled 2018-05-17: qty 2

## 2018-05-17 MED ORDER — SCOPOLAMINE 1 MG/3DAYS TD PT72
1.0000 | MEDICATED_PATCH | Freq: Once | TRANSDERMAL | Status: DC
  Administered 2018-05-17: 1.5 mg via TRANSDERMAL
  Filled 2018-05-17: qty 1

## 2018-05-17 MED ORDER — ACETAMINOPHEN 500 MG PO TABS
ORAL_TABLET | ORAL | Status: AC
  Filled 2018-05-17: qty 2

## 2018-05-17 MED ORDER — DEXAMETHASONE SODIUM PHOSPHATE 10 MG/ML IJ SOLN
INTRAMUSCULAR | Status: AC
  Filled 2018-05-17: qty 1

## 2018-05-17 MED ORDER — FENTANYL CITRATE (PF) 100 MCG/2ML IJ SOLN
INTRAMUSCULAR | Status: DC | PRN
  Administered 2018-05-17 (×2): 50 ug via INTRAVENOUS

## 2018-05-17 MED ORDER — OXYCODONE HCL 5 MG PO TABS
5.0000 mg | ORAL_TABLET | Freq: Once | ORAL | Status: AC
  Administered 2018-05-17: 5 mg via ORAL
  Filled 2018-05-17: qty 1

## 2018-05-17 MED ORDER — LACTATED RINGERS IV SOLN
INTRAVENOUS | Status: DC
  Administered 2018-05-17 (×2): via INTRAVENOUS
  Filled 2018-05-17: qty 1000

## 2018-05-17 MED ORDER — ROCURONIUM BROMIDE 100 MG/10ML IV SOLN
INTRAVENOUS | Status: DC | PRN
  Administered 2018-05-17: 40 mg via INTRAVENOUS

## 2018-05-17 MED ORDER — KETOROLAC TROMETHAMINE 30 MG/ML IJ SOLN
INTRAMUSCULAR | Status: AC
  Filled 2018-05-17: qty 1

## 2018-05-17 MED ORDER — LIDOCAINE 2% (20 MG/ML) 5 ML SYRINGE
INTRAMUSCULAR | Status: AC
  Filled 2018-05-17: qty 5

## 2018-05-17 MED ORDER — SUGAMMADEX SODIUM 200 MG/2ML IV SOLN
INTRAVENOUS | Status: DC | PRN
  Administered 2018-05-17: 150 mg via INTRAVENOUS

## 2018-05-17 MED ORDER — BUPIVACAINE HCL (PF) 0.25 % IJ SOLN
INTRAMUSCULAR | Status: AC
  Filled 2018-05-17: qty 30

## 2018-05-17 MED ORDER — PROMETHAZINE HCL 25 MG/ML IJ SOLN
6.2500 mg | INTRAMUSCULAR | Status: DC | PRN
  Filled 2018-05-17: qty 1

## 2018-05-17 MED ORDER — FENTANYL CITRATE (PF) 100 MCG/2ML IJ SOLN
25.0000 ug | INTRAMUSCULAR | Status: DC | PRN
  Administered 2018-05-17 (×2): 50 ug via INTRAVENOUS
  Filled 2018-05-17: qty 1

## 2018-05-17 MED ORDER — ONDANSETRON HCL 4 MG/2ML IJ SOLN
INTRAMUSCULAR | Status: DC | PRN
  Administered 2018-05-17: 4 mg via INTRAVENOUS

## 2018-05-17 MED ORDER — LIDOCAINE 2% (20 MG/ML) 5 ML SYRINGE
INTRAMUSCULAR | Status: DC | PRN
  Administered 2018-05-17: 80 mg via INTRAVENOUS

## 2018-05-17 MED ORDER — CEFAZOLIN SODIUM-DEXTROSE 2-4 GM/100ML-% IV SOLN
2.0000 g | Freq: Once | INTRAVENOUS | Status: AC
  Administered 2018-05-17: 2 g via INTRAVENOUS
  Filled 2018-05-17: qty 100

## 2018-05-17 MED ORDER — ACETAMINOPHEN 500 MG PO TABS
1000.0000 mg | ORAL_TABLET | Freq: Once | ORAL | Status: AC
  Administered 2018-05-17: 1000 mg via ORAL
  Filled 2018-05-17: qty 2

## 2018-05-17 SURGICAL SUPPLY — 25 items
ADH SKN CLS APL DERMABOND .7 (GAUZE/BANDAGES/DRESSINGS) ×1
CATH ROBINSON RED A/P 16FR (CATHETERS) ×3 IMPLANT
COVER MAYO STAND STRL (DRAPES) ×3 IMPLANT
DERMABOND ADVANCED (GAUZE/BANDAGES/DRESSINGS) ×2
DERMABOND ADVANCED .7 DNX12 (GAUZE/BANDAGES/DRESSINGS) ×1 IMPLANT
DRSG COVADERM PLUS 2X2 (GAUZE/BANDAGES/DRESSINGS) IMPLANT
DURAPREP 26ML APPLICATOR (WOUND CARE) ×3 IMPLANT
GLOVE BIO SURGEON STRL SZ8 (GLOVE) ×3 IMPLANT
GLOVE ORTHO TXT STRL SZ7.5 (GLOVE) ×3 IMPLANT
GOWN STRL REUS W/TWL XL LVL3 (GOWN DISPOSABLE) ×3 IMPLANT
NEEDLE INSUFFLATION 120MM (ENDOMECHANICALS) ×3 IMPLANT
NS IRRIG 500ML POUR BTL (IV SOLUTION) ×3 IMPLANT
PACK LAPAROSCOPY BASIN (CUSTOM PROCEDURE TRAY) ×2 IMPLANT
PACK TRENDGUARD 450 HYBRID PRO (MISCELLANEOUS) IMPLANT
PAD OB MATERNITY 4.3X12.25 (PERSONAL CARE ITEMS) ×3 IMPLANT
SUT VIC AB 3-0 CTX 36 (SUTURE) IMPLANT
SUT VIC AB 3-0 PS2 18 (SUTURE) ×3
SUT VIC AB 3-0 PS2 18XBRD (SUTURE) ×1 IMPLANT
SUT VICRYL 0 UR6 27IN ABS (SUTURE) ×3 IMPLANT
TOWEL OR 17X24 6PK STRL BLUE (TOWEL DISPOSABLE) ×6 IMPLANT
TRENDGUARD 450 HYBRID PRO PACK (MISCELLANEOUS) ×3
TROCAR XCEL NON-BLD 11X100MML (ENDOMECHANICALS) ×3 IMPLANT
TROCAR XCEL NON-BLD 5MMX100MML (ENDOMECHANICALS) ×3 IMPLANT
TUBING INSUF HEATED (TUBING) ×3 IMPLANT
WARMER LAPAROSCOPE (MISCELLANEOUS) ×3 IMPLANT

## 2018-05-17 NOTE — Discharge Instructions (Signed)
Routine instructions for laparoscopy ° °Laparoscopy- Care After °Refer to this sheet in the next few weeks. These instructions provide you with information about caring for yourself after your procedure. Your health care provider may also give you more specific instructions. Your treatment has been planned according to current medical practices, but problems sometimes occur. Call your health care provider if you have any problems or questions after your procedure. °What can I expect after the procedure? °After the procedure, it is common to have: °· A sore throat. °· Discomfort in your shoulder. °· Mild discomfort or cramping in your abdomen. °· Gas pains. °· Pain or soreness in the area where the surgical cut (incision) was made. °· A bloated feeling. °· Tiredness. °· Nausea. °· Vomiting. ° °Follow these instructions at home: °Medicines °· Take over-the-counter and prescription medicines only as told by your health care provider. °· Do not take aspirin because it can cause bleeding. °· Do not drive or operate heavy machinery while taking prescription pain medicine. °Activity °· Rest for the rest of the day. °· Return to your normal activities as told by your health care provider. Ask your health care provider what activities are safe for you. °Incision care ° °· Follow instructions from your health care provider about how to take care of your incision. Make sure you: °? Wash your hands with soap and water before you change your bandage (dressing). If soap and water are not available, use hand sanitizer. °? Change your dressing as told by your health care provider. °? Leave stitches (sutures) in place. They may need to stay in place for 2 weeks or longer. °· Check your incision area every day for signs of infection. Check for: °? More redness, swelling, or pain. °? More fluid or blood. °? Warmth. °? Pus or a bad smell. °Other Instructions °· Do not take baths, swim, or use a hot tub until your health care provider  approves. You may take showers. °· Keep all follow-up visits as told by your health care provider. This is important. °· Have someone help you with your daily household tasks for the first few days. °Contact a health care provider if: °· You have more redness, swelling, or pain around your incision. °· Your incision feels warm to the touch. °· You have pus or a bad smell coming from your incision. °· The edges of your incision break open after the sutures have been removed. °· Your pain does not improve after 2-3 days. °· You have a rash. °· You repeatedly become dizzy or light-headed. °· Your pain medicine is not helping. °· You are constipated. °Get help right away if: °· You have a fever. °· You faint. °· You have increasing pain in your abdomen. °· You have severe pain in one or both of your shoulders. °· You have fluid or blood coming from your sutures or from your vagina. °· You have shortness of breath or difficulty breathing. °· You have chest pain or leg pain. °· You have ongoing nausea, vomiting, or diarrhea. °This information is not intended to replace advice given to you by your health care provider. Make sure you discuss any questions you have with your health care provider. °Document Released: 03/31/2005 Document Revised: 02/14/2016 Document Reviewed: 08/22/2015 °Elsevier Interactive Patient Education © 2018 [employer] ° °Post Anesthesia Home Care Instructions ° °Activity: °Get plenty of rest for the remainder of the day. A responsible individual must stay with you for 24 hours following the procedure.  °  For the next 24 hours, DO NOT: -Drive a car -Paediatric nurse -Drink alcoholic beverages -Take any medication unless instructed by your physician -Make any legal decisions or sign important papers.  Meals: Start with liquid foods such as gelatin or soup. Progress to regular foods as tolerated. Avoid greasy, spicy, heavy foods. If nausea and/or vomiting occur, drink only clear liquids until  the nausea and/or vomiting subsides. Call your physician if vomiting continues.  Special Instructions/Symptoms: Your throat may feel dry or sore from the anesthesia or the breathing tube placed in your throat during surgery. If this causes discomfort, gargle with warm salt water. The discomfort should disappear within 24 hours.  If you had a scopolamine patch placed behind your ear for the management of post- operative nausea and/or vomiting:  1. The medication in the patch is effective for 72 hours, after which it should be removed.  Wrap patch in a tissue and discard in the trash. Wash hands thoroughly with soap and water. 2. You may remove the patch earlier than 72 hours if you experience unpleasant side effects which may include dry mouth, dizziness or visual disturbances. 3. Avoid touching the patch. Wash your hands with soap and water after contact with the patch.

## 2018-05-17 NOTE — Op Note (Signed)
Preoperative diagnosis: Desires surgical sterility Postoperative diagnosis: Same Procedure: Laparoscopic bilateral salpingectomy Surgeon: Lavina Hammanodd Quintina Hakeem M.D. Anesthesia: Gen. Endotracheal tube Findings: She had a normal abdomen and pelvis with normal uterus tubes and ovaries, omentum and anterior uterus adherent to lower abdominal wall Specimens: Bilateral fallopian tubes Estimated blood loss: Minimal Complications: None  Procedure in detail  The patient was taken to the operating room and placed in the dorsosupine position. General anesthesia was induced. Her legs were placed in mobile stirrups and her left arm was tucked to her side. Abdomen perineum and vagina were then prepped and draped in the usual sterile fashion, bladder drained with a red robinson catheter, an acorn tenaculum was applied to the cervix for uterine manipulation. Infraumbilical skin was then infiltrated with quarter percent Marcaine and a 1 cm vertical incision was made. The veress needle was inserted into the peritoneal cavity and placement confirmed by the water drop test and an opening pressure of 8 mm of mercury. CO2 was insufflated to a pressure of 14 mm of mercury and the veress needle was removed. A 10/11 disposable trocar was then introduced with direct visualization with the laparoscope. A 5 mm port was then placed low in the midline also under direct visualization. Inspection revealed the above-mentioned findings.  Omental adhesions were taken down with bipolar cautery and scissors.  She is not having significant pain so the uterine adhesions were left intact.  The distal end of each tube was grasped and elevated. Using bipolar cautery I was able to free the distal end of each tube from the ovary and fulgurate across the mesosalpinx and a proximal portion of the fallopian tube. Scissors were then used to remove the fallopian tube. This is done bilaterally without difficulty. Both segments of tube were removed through the  umbilical trocar. The 5 mm port was removed under direct visualization. All gas was allowed to deflate from the abdomen and the umbilical trocar was removed. One figure-of-eight suture of 0 Vicryl was placed in the umbilical incision. Skin incisions were then closed with interrupted subcuticular sutures of 4-0 Vicryl followed by Dermabond. The acorn tenaculum was removed. The patient was taken down from stirrups. She was awakened in the operating room and taken to the recovery room in stable condition after tolerating the procedure well. Counts were correct and she had PAS hose on throughout the procedure.

## 2018-05-17 NOTE — Interval H&P Note (Signed)
History and Physical Interval Note:  05/17/2018 9:40 AM  Brittney [name]  has presented today for surgery, with the diagnosis of Sterilization  The various methods of treatment have been discussed with the patient and family. After consideration of risks, benefits and other options for treatment, the patient has consented to  Procedure(s): LAPAROSCOPIC TUBAL LIGATION (Bilateral) as a surgical intervention .  The patient's history has been reviewed, patient examined, no change in status, stable for surgery.  I have reviewed the patient's chart and labs.  Questions were answered to the patient's satisfaction.     Leighton Roachodd D Erick Oxendine

## 2018-05-17 NOTE — Anesthesia Procedure Notes (Signed)
Procedure Name: Intubation Date/Time: 05/17/2018 10:06 AM Performed by: Marny Lowensteinapozzi, Nyheim Seufert W, CRNA Pre-anesthesia Checklist: Patient identified, Emergency Drugs available, Suction available and Patient being monitored Patient Re-evaluated:Patient Re-evaluated prior to induction Oxygen Delivery Method: Circle system utilized Preoxygenation: Pre-oxygenation with 100% oxygen Induction Type: IV induction Ventilation: Mask ventilation without difficulty Laryngoscope Size: Miller and 2 Grade View: Grade I Tube type: Oral Number of attempts: 1 Placement Confirmation: ETT inserted through vocal cords under direct vision,  positive ETCO2,  CO2 detector and breath sounds checked- equal and bilateral Secured at: 21 cm Tube secured with: Tape Dental Injury: Teeth and Oropharynx as per pre-operative assessment

## 2018-05-17 NOTE — Anesthesia Postprocedure Evaluation (Signed)
Anesthesia Post Note  Patient: Brittney [name]  Procedure(s) Performed: LAPAROSCOPIC BILATERAL SALPINGECTOMY (Bilateral )     Patient location during evaluation: PACU Anesthesia Type: General Level of consciousness: awake and alert, oriented and awake Pain management: pain level controlled Vital Signs Assessment: post-procedure vital signs reviewed and stable Respiratory status: spontaneous breathing, nonlabored ventilation and respiratory function stable Cardiovascular status: blood pressure returned to baseline and stable Postop Assessment: no apparent nausea or vomiting Anesthetic complications: no    Last Vitals:  Vitals:   05/17/18 1145 05/17/18 1230  BP: (!) 153/99 (!) 150/83  Pulse: 62 65  Resp: 13 14  Temp:  36.7 C  SpO2: 100% 100%    Last Pain:  Vitals:   05/17/18 1200  TempSrc:   PainSc: 4                  Cecile HearingStephen Edward Therman Hughlett

## 2018-05-17 NOTE — Anesthesia Preprocedure Evaluation (Addendum)
Anesthesia Evaluation  Patient identified by MRN, date of birth, ID band Patient awake    Reviewed: Allergy & Precautions, NPO status , Patient's Chart, lab work & pertinent test results  Airway Mallampati: II  TM Distance: >3 FB Neck ROM: Full    Dental  (+) Teeth Intact, Dental Advisory Given   Pulmonary Current Smoker (+Smoked on DOS),    Pulmonary exam normal breath sounds clear to auscultation       Cardiovascular hypertension, Normal cardiovascular exam Rhythm:Regular Rate:Normal     Neuro/Psych  Headaches, negative psych ROS   GI/Hepatic negative GI ROS, (+)     substance abuse  alcohol use,   Endo/Other  negative endocrine ROS  Renal/GU negative Renal ROS     Musculoskeletal negative musculoskeletal ROS (+)   Abdominal   Peds  Hematology negative hematology ROS (+)   Anesthesia Other Findings Day of surgery medications reviewed with the patient.  Reproductive/Obstetrics                           Anesthesia Physical Anesthesia Plan  ASA: II  Anesthesia Plan: General   Post-op Pain Management:    Induction: Intravenous  PONV Risk Score and Plan: 3 and Midazolam, Dexamethasone, Ondansetron and Scopolamine patch - Pre-op  Airway Management Planned: Oral ETT  Additional Equipment:   Intra-op Plan:   Post-operative Plan: Extubation in OR  Informed Consent: I have reviewed the patients History and Physical, chart, labs and discussed the procedure including the risks, benefits and alternatives for the proposed anesthesia with the patient or authorized representative who has indicated his/her understanding and acceptance.   Dental advisory given  Plan Discussed with: CRNA  Anesthesia Plan Comments:        Anesthesia Quick Evaluation

## 2018-05-17 NOTE — Transfer of Care (Signed)
Immediate Anesthesia Transfer of Care Note  Patient: Brittney HarveyJennifer [name]  Procedure(s) Performed: LAPAROSCOPIC BILATERAL SALPINGECTOMY (Bilateral )  Patient Location: PACU  Anesthesia Type:General  Level of Consciousness: drowsy and patient cooperative  Airway & Oxygen Therapy: Patient Spontanous Breathing and Patient connected to face mask oxygen  Post-op Assessment: Report given to RN and Post -op Vital signs reviewed and stable  Post vital signs: Reviewed and stable  Last Vitals:  Vitals Value Taken Time  BP    Temp 36.4 C 05/17/2018 11:02 AM  Pulse 93 05/17/2018 11:05 AM  Resp 12 05/17/2018 11:05 AM  SpO2 100 % 05/17/2018 11:05 AM  Vitals shown include unvalidated device data.  Last Pain:  Vitals:   05/17/18 0837  TempSrc:   PainSc: 0-No pain      Patients Stated Pain Goal: 5 (05/17/18 [id number])  Complications: No apparent anesthesia complications

## 2018-05-17 NOTE — Progress Notes (Signed)
Pre-op urine cx just came back + for E. Coli.  Will treat with IV Ancef since here.

## 2018-05-20 ENCOUNTER — Encounter (HOSPITAL_BASED_OUTPATIENT_CLINIC_OR_DEPARTMENT_OTHER): Payer: Self-pay | Admitting: Obstetrics and Gynecology

## 2024-03-17 ENCOUNTER — Encounter (HOSPITAL_COMMUNITY): Payer: Self-pay | Admitting: Obstetrics and Gynecology

## 2024-03-17 NOTE — Progress Notes (Signed)
 Spoke w/ via phone for pre-op interview--- Brittney [name] needs dos----  CBC, BMP and UPT per surgeon. EKG per anesthesia.       [name] results------ COVID test -----patient states asymptomatic no test needed Arrive at -------0930 NPO after MN NO Solid Food.  Clear liquids from MN until---0830 Pre-Surgery Ensure or G2:  Med rec completed Medications to take morning of surgery -----Xanax PRN Diabetic medication -----  GLP1 agonist last dose: GLP1 instructions:  Patient instructed no nail polish to be worn day of surgery Patient instructed to bring photo id and insurance card day of surgery Patient aware to have Driver (ride ) / caregiver    for 24 hours after surgery - Husband Brittney [name] Patient Special Instructions -----shower with antibacterial soap. Pre-Op special Instructions -----  Patient verbalized understanding of instructions that were given at this phone interview. Patient denies chest pain, sob, fever, cough at the interview.

## 2024-03-19 NOTE — H&P (Signed)
 Brittney [name] is an 43 y.o. female. At annual exam in January, having irregular menses every 1-2 months, for the past 2 years heavy for 1-2 days, some cramps. Since then menses have become heavier. On SIUS end of May, normal endometrial cavity with possible fundal polyp, pipelle benign. She wishes to proceed with endometrial ablation  Pertinent Gynecological History: Last mammogram: normal Date: 09/2023 Last pap: normal Date: 09/2023 OB History: G1, P0101 LTCS   Menstrual History: Patient's last menstrual period was 03/03/2024 (approximate).    Past Medical History:  Diagnosis Date   History of migraine    History of palpitations    Hypertension    05-10-2018  per pt has been dx w/ htn but has taken any medication since 2015,  per pt last bp taken was 140/ 90   Migraines    Wears glasses   Sudden cardiac arrest  Past Surgical History:  Procedure Laterality Date   CARDIOVASCULAR STRESS TEST  12-19-2013   @[facility]   normal nuclear perfusion w/ no ischemia/  normal LV function and wall motion ,  ef 68%   CESAREAN SECTION  2003   LAPAROSCOPIC BILATERAL SALPINGECTOMY Bilateral 05/17/2018   Procedure: LAPAROSCOPIC BILATERAL SALPINGECTOMY;  Surgeon: Horacio Boas, MD;  Location: [facility];  Service: Gynecology;  Laterality: Bilateral;   TONSILLECTOMY  age 54   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 She has a 22 pack-year smoking history. She has never used smokeless tobacco. She reports current alcohol use of about 14.0 standard drinks of alcohol per week. She reports that she does not use drugs.  Allergies: No Known Allergies  No medications prior to admission.    Review of Systems  Respiratory: Negative.     Cardiovascular: Negative.     Height 5' 7 (1.702 m), weight 64.4 kg, last menstrual period 03/03/2024. Physical Exam  Cardiovascular:     Rate and Rhythm: Normal rate and regular rhythm.     Heart sounds: Normal heart sounds. No murmur heard. Pulmonary:     Effort: Pulmonary effort is normal. No respiratory distress.     Breath sounds: Normal breath sounds. No wheezing.  Abdominal:     General: There is no distension.     Palpations: Abdomen is soft. There is no mass.     Tenderness: There is no abdominal tenderness.  Genitourinary:    General: Normal vulva.     Comments: Uterus normal, no adnexal mass  Musculoskeletal:     Cervical back: Normal range of motion and neck supple.   Neurological:     Mental Status: She is alert.     No results found for this or any previous visit (from the past 24 hours).  No results found.  Assessment/Plan: Menorrhagia, possible endometrial polyp. She has tried NSAIDs without improvement. All other medical and surgical options discussed, she wants endometrial ablation. Surgical procedure, risks, alternatives, chances of improving her bleeding all discussed, questions answered. Will admit for hysteroscopy, possible myosure resection of polyp, Novasure endometrial ablation  Boas BIRCH Eluterio Seymour 03/19/2024, 7:51 AM

## 2024-03-20 ENCOUNTER — Other Ambulatory Visit: Payer: Self-pay

## 2024-03-20 ENCOUNTER — Encounter (HOSPITAL_COMMUNITY): Payer: Self-pay | Admitting: Obstetrics and Gynecology

## 2024-03-20 ENCOUNTER — Ambulatory Visit (HOSPITAL_COMMUNITY): Payer: Self-pay | Admitting: Anesthesiology

## 2024-03-20 ENCOUNTER — Encounter (HOSPITAL_COMMUNITY)
Admission: RE | Disposition: A | Discharge status: Home / Self Care | Payer: Self-pay | Source: Home / Self Care | Attending: Obstetrics and Gynecology

## 2024-03-20 ENCOUNTER — Ambulatory Visit (HOSPITAL_COMMUNITY)
Admission: RE | Admit: 2024-03-20 | Discharge: 2024-03-20 | Disposition: A | Discharge status: Home / Self Care | Attending: Obstetrics and Gynecology | Admitting: Obstetrics and Gynecology

## 2024-03-20 ENCOUNTER — Ambulatory Visit (HOSPITAL_BASED_OUTPATIENT_CLINIC_OR_DEPARTMENT_OTHER): Payer: Self-pay | Admitting: Anesthesiology

## 2024-03-20 DIAGNOSIS — R519 Headache, unspecified: Secondary | ICD-10-CM | POA: Diagnosis not present

## 2024-03-20 DIAGNOSIS — I2510 Atherosclerotic heart disease of native coronary artery without angina pectoris: Secondary | ICD-10-CM

## 2024-03-20 DIAGNOSIS — N920 Excessive and frequent menstruation with regular cycle: Secondary | ICD-10-CM | POA: Diagnosis present

## 2024-03-20 DIAGNOSIS — I10 Essential (primary) hypertension: Secondary | ICD-10-CM

## 2024-03-20 DIAGNOSIS — F17210 Nicotine dependence, cigarettes, uncomplicated: Secondary | ICD-10-CM | POA: Diagnosis not present

## 2024-03-20 DIAGNOSIS — Z95810 Presence of automatic (implantable) cardiac defibrillator: Secondary | ICD-10-CM | POA: Insufficient documentation

## 2024-03-20 DIAGNOSIS — I472 Ventricular tachycardia, unspecified: Secondary | ICD-10-CM | POA: Diagnosis not present

## 2024-03-20 HISTORY — PX: DILATATION & CURRETTAGE/HYSTEROSCOPY WITH RESECTOCOPE: SHX5572

## 2024-03-20 HISTORY — PX: HYDROTHERMAL ABLATION/NOVASURE: SHX7586

## 2024-03-20 LAB — CBC
HCT: 41.3 % (ref 36.0–46.0)
Hemoglobin: 13.8 g/dL (ref 12.0–15.0)
MCH: 34.2 pg — ABNORMAL HIGH (ref 26.0–34.0)
MCHC: 33.4 g/dL (ref 30.0–36.0)
MCV: 102.2 fL — ABNORMAL HIGH (ref 80.0–100.0)
Platelets: 161 10*3/uL (ref 150–400)
RBC: 4.04 MIL/uL (ref 3.87–5.11)
RDW: 12.3 % (ref 11.5–15.5)
WBC: 7.3 10*3/uL (ref 4.0–10.5)
nRBC: 0 % (ref 0.0–0.2)

## 2024-03-20 LAB — POCT PREGNANCY, URINE: Preg Test, Ur: NEGATIVE

## 2024-03-20 SURGERY — DILATATION & CURETTAGE/HYSTEROSCOPY WITH RESECTOCOPE
Anesthesia: General | Site: Vagina

## 2024-03-20 MED ORDER — FENTANYL CITRATE (PF) 100 MCG/2ML IJ SOLN
INTRAMUSCULAR | Status: AC
  Filled 2024-03-20: qty 2

## 2024-03-20 MED ORDER — LIDOCAINE HCL 2 % IJ SOLN
INTRAMUSCULAR | Status: AC
  Filled 2024-03-20: qty 20

## 2024-03-20 MED ORDER — WHITE PETROLATUM EX OINT
TOPICAL_OINTMENT | CUTANEOUS | Status: AC
  Filled 2024-03-20: qty 28.35

## 2024-03-20 MED ORDER — DEXAMETHASONE SODIUM PHOSPHATE 10 MG/ML IJ SOLN
INTRAMUSCULAR | Status: DC | PRN
  Administered 2024-03-20: 10 mg via INTRAVENOUS

## 2024-03-20 MED ORDER — IBUPROFEN 600 MG PO TABS: 600.0000 mg | ORAL_TABLET | Freq: Four times a day (QID) | ORAL | 0 refills | Status: AC | PRN

## 2024-03-20 MED ORDER — CELECOXIB 200 MG PO CAPS: 200.0000 mg | ORAL_CAPSULE | Freq: Once | ORAL | Status: AC

## 2024-03-20 MED ORDER — MIDAZOLAM HCL 5 MG/5ML IJ SOLN
INTRAMUSCULAR | Status: DC | PRN
  Administered 2024-03-20 (×2): 2 mg via INTRAVENOUS

## 2024-03-20 MED ORDER — OXYCODONE HCL 5 MG PO TABS: 5.0000 mg | ORAL_TABLET | Freq: Once | ORAL | Status: DC | PRN

## 2024-03-20 MED ORDER — CHLORHEXIDINE GLUCONATE 0.12 % MT SOLN: 15.0000 mL | Freq: Once | OROMUCOSAL | Status: AC

## 2024-03-20 MED ORDER — LACTATED RINGERS IV SOLN: INTRAVENOUS | Status: DC

## 2024-03-20 MED ORDER — ONDANSETRON HCL 4 MG/2ML IJ SOLN
INTRAMUSCULAR | Status: DC | PRN
  Administered 2024-03-20: 4 mg via INTRAVENOUS

## 2024-03-20 MED ORDER — ACETAMINOPHEN 500 MG PO TABS: 1000.0000 mg | ORAL_TABLET | Freq: Once | ORAL | Status: AC

## 2024-03-20 MED ORDER — SCOPOLAMINE 1 MG/3DAYS TD PT72
MEDICATED_PATCH | TRANSDERMAL | Status: AC
  Filled 2024-03-20: qty 1

## 2024-03-20 MED ORDER — SODIUM CHLORIDE 0.9 % IR SOLN
Status: DC | PRN
  Administered 2024-03-20: 3000 mL

## 2024-03-20 MED ORDER — CHLORHEXIDINE GLUCONATE 0.12 % MT SOLN
OROMUCOSAL | Status: AC
  Administered 2024-03-20: 15 mL via OROMUCOSAL
  Filled 2024-03-20: qty 15

## 2024-03-20 MED ORDER — ORAL CARE MOUTH RINSE: 15.0000 mL | Freq: Once | OROMUCOSAL | Status: AC

## 2024-03-20 MED ORDER — FENTANYL CITRATE (PF) 250 MCG/5ML IJ SOLN
INTRAMUSCULAR | Status: AC
  Filled 2024-03-20: qty 5

## 2024-03-20 MED ORDER — AMISULPRIDE (ANTIEMETIC) 5 MG/2ML IV SOLN: 10.0000 mg | Freq: Once | INTRAVENOUS | Status: DC | PRN

## 2024-03-20 MED ORDER — FENTANYL CITRATE (PF) 100 MCG/2ML IJ SOLN
25.0000 ug | INTRAMUSCULAR | Status: DC | PRN
  Administered 2024-03-20: 25 ug via INTRAVENOUS

## 2024-03-20 MED ORDER — SCOPOLAMINE 1 MG/3DAYS TD PT72
1.0000 | MEDICATED_PATCH | TRANSDERMAL | Status: DC
  Administered 2024-03-20: 1.5 mg via TRANSDERMAL

## 2024-03-20 MED ORDER — ACETAMINOPHEN 500 MG PO TABS
ORAL_TABLET | ORAL | Status: AC
  Administered 2024-03-20: 1000 mg via ORAL
  Filled 2024-03-20: qty 2

## 2024-03-20 MED ORDER — LIDOCAINE HCL 2 % IJ SOLN
INTRAMUSCULAR | Status: DC | PRN
Start: 2024-03-20 — End: 2024-03-20
  Administered 2024-03-20: 16 mL

## 2024-03-20 MED ORDER — SODIUM CHLORIDE 0.9 % IV SOLN: 12.5000 mg | INTRAVENOUS | Status: DC | PRN

## 2024-03-20 MED ORDER — CELECOXIB 200 MG PO CAPS
ORAL_CAPSULE | ORAL | Status: AC
  Administered 2024-03-20: 200 mg via ORAL
  Filled 2024-03-20: qty 1

## 2024-03-20 MED ORDER — FENTANYL CITRATE (PF) 100 MCG/2ML IJ SOLN
INTRAMUSCULAR | Status: DC | PRN
  Administered 2024-03-20: 100 ug via INTRAVENOUS
  Administered 2024-03-20: 50 ug via INTRAVENOUS

## 2024-03-20 MED ORDER — OXYCODONE HCL 5 MG/5ML PO SOLN: 5.0000 mg | Freq: Once | ORAL | Status: DC | PRN

## 2024-03-20 MED ORDER — PROPOFOL 10 MG/ML IV BOLUS
INTRAVENOUS | Status: DC | PRN
  Administered 2024-03-20: 150 mg via INTRAVENOUS
  Administered 2024-03-20: 50 mg via INTRAVENOUS

## 2024-03-20 MED ORDER — MIDAZOLAM HCL 2 MG/2ML IJ SOLN
INTRAMUSCULAR | Status: AC
Start: 2024-03-20 — End: 2024-03-20
  Filled 2024-03-20: qty 2

## 2024-03-20 MED ORDER — MIDAZOLAM HCL 2 MG/2ML IJ SOLN
INTRAMUSCULAR | Status: AC
  Filled 2024-03-20: qty 2

## 2024-03-20 MED ORDER — LIDOCAINE HCL (CARDIAC) PF 100 MG/5ML IV SOSY
PREFILLED_SYRINGE | INTRAVENOUS | Status: DC | PRN
  Administered 2024-03-20: 60 mg via INTRAVENOUS

## 2024-03-20 MED ORDER — HYDROCODONE-ACETAMINOPHEN 5-325 MG PO TABS
1.0000 | ORAL_TABLET | Freq: Four times a day (QID) | ORAL | 0 refills | Status: AC | PRN
Start: 2024-03-20 — End: ?

## 2024-03-20 SURGICAL SUPPLY — 19 items
ABLATOR SURESOUND NOVASURE (ABLATOR) ×4 IMPLANT
CATH ROBINSON RED A/P 16FR (CATHETERS) ×3 IMPLANT
COVER MAYO STAND STRL (DRAPES) ×4 IMPLANT
DEVICE MYOSURE LITE (MISCELLANEOUS) IMPLANT
DEVICE MYOSURE REACH (MISCELLANEOUS) IMPLANT
DILATOR CANAL MILEX (MISCELLANEOUS) IMPLANT
GLOVE BIO SURGEON STRL SZ8 (GLOVE) ×4 IMPLANT
GLOVE ORTHO TXT STRL SZ7.5 (GLOVE) ×4 IMPLANT
GLOVE SURG UNDER POLY LF SZ7 (GLOVE) ×4 IMPLANT
GOWN STRL REUS W/ TWL LRG LVL3 (GOWN DISPOSABLE) ×8 IMPLANT
GOWN STRL REUS W/ TWL XL LVL3 (GOWN DISPOSABLE) ×4 IMPLANT
KIT PROCEDURE FLUENT (KITS) ×4 IMPLANT
KIT TURNOVER KIT B (KITS) ×4 IMPLANT
PACK VAGINAL MINOR WOMEN LF (CUSTOM PROCEDURE TRAY) ×4 IMPLANT
PAD OB MATERNITY 11 LF (PERSONAL CARE ITEMS) ×4 IMPLANT
SEAL ROD LENS SCOPE MYOSURE (ABLATOR) ×4 IMPLANT
SOL .9 NS 3000ML IRR UROMATIC (IV SOLUTION) ×1 IMPLANT
TOWEL GREEN STERILE FF (TOWEL DISPOSABLE) ×8 IMPLANT
UNDERPAD 30X36 HEAVY ABSORB (UNDERPADS AND DIAPERS) ×4 IMPLANT

## 2024-03-20 NOTE — Discharge Instructions (Addendum)
  DO NOT TAKE TYLENOL  UNTIL AFTER 4PM. DO NOT TAKE MOTRIN UNTIL AFTER 4PM.    Routine instructions for hysteroscopy and endometrial ablation Post Anesthesia Home Care Instructions  Activity: Get plenty of rest for the remainder of the day. A responsible adult should stay with you for 24 hours following the procedure.  For the next 24 hours, DO NOT: -Drive a car -Advertising copywriter -Drink alcoholic beverages -Take any medication unless instructed by your physician -Make any legal decisions or sign important papers.  Meals: Start with liquid foods such as gelatin or soup. Progress to regular foods as tolerated. Avoid greasy, spicy, heavy foods. If nausea and/or vomiting occur, drink only clear liquids until the nausea and/or vomiting subsides. Call your physician if vomiting continues.  Special Instructions/Symptoms: Your throat may feel dry or sore from the anesthesia or the breathing tube placed in your throat during surgery. If this causes discomfort, gargle with warm salt water. The discomfort should disappear within 24 hours.  If you had a scopolamine  patch placed behind your ear for the management of post- operative nausea and/or vomiting:  1. The medication in the patch is effective for 72 hours, after which it should be removed.  Wrap patch in a tissue and discard in the trash. Wash hands thoroughly with soap and water. 2. You may remove the patch earlier than 72 hours if you experience unpleasant side effects which may include dry mouth, dizziness or visual disturbances. 3. Avoid touching the patch. Wash your hands with soap and water after contact with the patch.  Call your surgeon if you experience:   1.  Fever over 101.0. 2.  Inability to urinate. 3.  Nausea and/or vomiting. 4.  Extreme swelling or bruising at the surgical site. 5.  Continued bleeding from the incision. 6.  Increased pain, redness or drainage from the incision. 7.  Problems related to your pain  medication. 8. Any change in color, movement and/or sensation 9. Any problems and/or concerns

## 2024-03-20 NOTE — Op Note (Signed)
 Preoperative diagnosis: Menorrhagia Postoperative diagnosis: Menorrhagia Procedure: Hysteroscopy, NovaSure endometrial ablation Surgeon: Krystal Deaner M.D. Anesthesia: Gen. With an LMA, deep paracervical block Findings: She had a normal endometrial cavity with minimal endometrial tissue and an arcuate shaped cavity. The NovaSure device used to a depth of 5.5 cm, a width of 3.2 cm and used 100 W for 39 seconds. Fluid deficit to the hysteroscope was 90 cc. Estimated blood loss: Minimal Specimens: None Complications: None  Procedure in detail: The patient was taken to the operating room and placed in the dorsosupine position. General anesthesia was induced and she was placed in mobile stirrups. Perineum and vagina were prepped and draped in usual sterile fashion and bladder drained with a red Robinson catheter. A Graves speculum was inserted into the vagina and the anterior lip of the cervix was grasped with a single-tooth tenaculum. The paracervical block was then performed with a total of 16 cc 2% lidocaine . Uterus then sounded to 9 cm. Cervix was easily dilated to size 21 dilator. The observer hysteroscope was inserted and good visualization was achieved using lactated Ringer 's. The endometrial cavity was normal with no fibroids or significant polyps, cavity arcuate shaped. The hysteroscope was removed. The cervix was further dilated to a size 23 dilator measuring the cervix at 3.5 cm. The NovaSure device was inserted and deployed properly. The CO2 test passed. Endometrial ablation was performed with the above-mentioned settings without difficulty. The device was then allowed to cool for about 30 seconds and was removed. Hysteroscopy was then performed which revealed good global endometrial ablation and still no lesions. Hysteroscope and fluid were then removed. The single-tooth tenaculum was removed from the cervix. Bleeding was controlled with pressure. All instruments were then removed from the  vagina. The patient tolerated the procedure well and was taken to the recovery in stable condition. Counts were correct, she received no antibiotics, she had PAS hose on throughout the procedure.

## 2024-03-20 NOTE — Anesthesia Postprocedure Evaluation (Signed)
 Anesthesia Post Note  Patient: Brittney [name]  Procedure(s) Performed: DILATATION & CURETTAGE/HYSTEROSCOPY (Vagina ) NOVASURE ENDOMETRIAL  ABLATION (Uterus)     Patient location during evaluation: PACU Anesthesia Type: General Level of consciousness: awake and alert Pain management: pain level controlled Vital Signs Assessment: post-procedure vital signs reviewed and stable Respiratory status: spontaneous breathing, nonlabored ventilation and respiratory function stable Cardiovascular status: stable and blood pressure returned to baseline Anesthetic complications: no   No notable events documented.  Last Vitals:  Vitals:   03/20/24 1145 03/20/24 1200  BP: (!) 145/91 (!) 144/102  Pulse: 81 71  Resp: 15 (!) 9  Temp:    SpO2: 100% 100%    Last Pain:  Vitals:   03/20/24 1200  TempSrc:   PainSc: 4                  Debby FORBES Like

## 2024-03-20 NOTE — Anesthesia Procedure Notes (Signed)
 Procedure Name: LMA Insertion Date/Time: 03/20/2024 11:04 AM  Performed by: Delores Duwaine SAUNDERS, CRNAPre-anesthesia Checklist: Patient identified, Emergency Drugs available, Suction available and Patient being monitored Patient Re-evaluated:Patient Re-evaluated prior to induction Oxygen Delivery Method: Circle System Utilized Preoxygenation: Pre-oxygenation with 100% oxygen Induction Type: IV induction Ventilation: Mask ventilation without difficulty LMA: LMA inserted LMA Size: 4.0 Number of attempts: 1 Airway Equipment and Method: Bite block Placement Confirmation: positive ETCO2 Tube secured with: Tape Dental Injury: Teeth and Oropharynx as per pre-operative assessment

## 2024-03-20 NOTE — Transfer of Care (Signed)
 Immediate Anesthesia Transfer of Care Note  Patient: Brittney [name]  Procedure(s) Performed: DILATATION & CURETTAGE/HYSTEROSCOPY (Vagina ) NOVASURE ENDOMETRIAL  ABLATION (Uterus)  Patient Location: PACU  Anesthesia Type:General  Level of Consciousness: awake and alert   Airway & Oxygen Therapy: Patient Spontanous Breathing  Post-op Assessment: Report given to RN  Post vital signs: Reviewed and stable  Last Vitals:  Vitals Value Taken Time  BP 135/106 03/20/24 11:40  Temp    Pulse 89 03/20/24 11:43  Resp 13 03/20/24 11:43  SpO2 100 % 03/20/24 11:43  Vitals shown include unfiled device data.  Last Pain:  Vitals:   03/20/24 1015  TempSrc: Oral  PainSc: 0-No pain         Complications: No notable events documented.

## 2024-03-20 NOTE — Interval H&P Note (Signed)
 History and Physical Interval Note:  03/20/2024 10:39 AM  Brittney [name]  has presented today for surgery, with the diagnosis of menorrhagia.  The various methods of treatment have been discussed with the patient and family. After consideration of risks, benefits and other options for treatment, the patient has consented to  Procedure(s): DILATATION & CURETTAGE/HYSTEROSCOPY WITH RESECTOCOPE (N/A) NOVASURE ABLATION (N/A) MYOSURE RESECTION (N/A) as a surgical intervention.  The patient's history has been reviewed, patient examined, no change in status, stable for surgery.  I have reviewed the patient's chart and labs.  Questions were answered to the patient's satisfaction.     Krystal BIRCH Jestine Bicknell

## 2024-03-20 NOTE — Anesthesia Preprocedure Evaluation (Addendum)
 Anesthesia Evaluation  Patient identified by MRN, date of birth, ID band Patient awake    Reviewed: Allergy & Precautions, NPO status , Patient's Chart, lab work & pertinent test results  History of Anesthesia Complications Negative for: history of anesthetic complications  Airway Mallampati: I  TM Distance: >3 FB Neck ROM: Full    Dental  (+) Dental Advisory Given, Teeth Intact   Pulmonary Current SmokerPatient did not abstain from smoking.   Pulmonary exam normal        Cardiovascular hypertension, Pt. on medications + CAD  Normal cardiovascular exam+ dysrhythmias Supra Ventricular Tachycardia + Cardiac Defibrillator    '21 TTE - EF 55-60%. Trace AI and PR, mild MR and TR.  AICD placed following cardiac arrest in 2021 See Care Everywhere for cardiac clearance    Neuro/Psych  Headaches  negative psych ROS   GI/Hepatic negative GI ROS,,,(+)     substance abuse  alcohol use  Endo/Other  negative endocrine ROS    Renal/GU negative Renal ROS     Musculoskeletal negative musculoskeletal ROS (+)    Abdominal   Peds  Hematology negative hematology ROS (+)   Anesthesia Other Findings   Reproductive/Obstetrics  S/p b/l salpingectomy                              Anesthesia Physical Anesthesia Plan  ASA: 3  Anesthesia Plan: General   Post-op Pain Management: Tylenol  PO (pre-op)* and Celebrex PO (pre-op)*   Induction: Intravenous  PONV Risk Score and Plan: 2 and Treatment may vary due to age or medical condition, Ondansetron , Dexamethasone , Midazolam  and Scopolamine  patch - Pre-op  Airway Management Planned: LMA  Additional Equipment: None  Intra-op Plan:   Post-operative Plan: Extubation in OR  Informed Consent: I have reviewed the patients History and Physical, chart, labs and discussed the procedure including the risks, benefits and alternatives for the proposed anesthesia  with the patient or authorized representative who has indicated his/her understanding and acceptance.     Dental advisory given  Plan Discussed with: CRNA and Anesthesiologist  Anesthesia Plan Comments:        Anesthesia Quick Evaluation

## 2024-03-21 ENCOUNTER — Encounter (HOSPITAL_COMMUNITY): Payer: Self-pay | Admitting: Obstetrics and Gynecology
